# Patient Record
Sex: Female | Born: 1935 | Race: White | Hispanic: No | Marital: Married | Smoking: Never smoker
Health system: Southern US, Community
[De-identification: ages and names within clinical notes are randomized; demographics above are authoritative.]

## PROBLEM LIST (undated history)

## (undated) DIAGNOSIS — D649 Anemia, unspecified: Secondary | ICD-10-CM

## (undated) DIAGNOSIS — I499 Cardiac arrhythmia, unspecified: Secondary | ICD-10-CM

## (undated) DIAGNOSIS — R569 Unspecified convulsions: Secondary | ICD-10-CM

## (undated) DIAGNOSIS — K219 Gastro-esophageal reflux disease without esophagitis: Secondary | ICD-10-CM

## (undated) DIAGNOSIS — I10 Essential (primary) hypertension: Secondary | ICD-10-CM

## (undated) DIAGNOSIS — R296 Repeated falls: Secondary | ICD-10-CM

## (undated) DIAGNOSIS — F419 Anxiety disorder, unspecified: Secondary | ICD-10-CM

## (undated) DIAGNOSIS — J449 Chronic obstructive pulmonary disease, unspecified: Secondary | ICD-10-CM

## (undated) DIAGNOSIS — C50919 Malignant neoplasm of unspecified site of unspecified female breast: Secondary | ICD-10-CM

## (undated) DIAGNOSIS — F0150 Vascular dementia without behavioral disturbance: Secondary | ICD-10-CM

## (undated) HISTORY — PX: ABDOMINAL HYSTERECTOMY: SHX81

## (undated) HISTORY — PX: EYE SURGERY: SHX253

## (undated) HISTORY — PX: APPENDECTOMY: SHX54

---
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
## 1973-11-11 DIAGNOSIS — C50919 Malignant neoplasm of unspecified site of unspecified female breast: Secondary | ICD-10-CM

## 1973-11-11 HISTORY — PX: MASTECTOMY: SHX3

## 1973-11-11 HISTORY — DX: Malignant neoplasm of unspecified site of unspecified female breast: C50.919

## 2004-05-05 ENCOUNTER — Other Ambulatory Visit: Payer: Self-pay

## 2004-09-13 ENCOUNTER — Ambulatory Visit: Payer: Self-pay | Admitting: Gastroenterology

## 2006-02-19 ENCOUNTER — Ambulatory Visit: Payer: Self-pay | Admitting: Family Medicine

## 2007-03-27 ENCOUNTER — Ambulatory Visit: Payer: Self-pay | Admitting: Family Medicine

## 2008-04-11 ENCOUNTER — Ambulatory Visit: Payer: Self-pay | Admitting: Family Medicine

## 2009-08-23 ENCOUNTER — Ambulatory Visit: Payer: Self-pay | Admitting: Family Medicine

## 2010-09-11 ENCOUNTER — Ambulatory Visit: Payer: Self-pay | Admitting: Family Medicine

## 2011-11-14 ENCOUNTER — Ambulatory Visit: Payer: Self-pay | Admitting: Family Medicine

## 2013-07-22 ENCOUNTER — Observation Stay: Payer: Self-pay | Admitting: Internal Medicine

## 2013-07-22 LAB — URINALYSIS, COMPLETE
Bacteria: NONE SEEN
Blood: NEGATIVE
Glucose,UR: NEGATIVE mg/dL (ref 0–75)
Leukocyte Esterase: NEGATIVE
Ph: 7 (ref 4.5–8.0)
Protein: NEGATIVE
RBC,UR: 3 /HPF (ref 0–5)
WBC UR: <1 /HPF (ref 0–5)

## 2013-07-22 LAB — CBC
HCT: 39.1 % (ref 35.0–47.0)
MCH: 31 pg (ref 26.0–34.0)
MCV: 90 fL (ref 80–100)
RDW: 12.9 % (ref 11.5–14.5)

## 2013-07-22 LAB — COMPREHENSIVE METABOLIC PANEL
Alkaline Phosphatase: 81 U/L (ref 50–136)
Anion Gap: 6 — ABNORMAL LOW (ref 7–16)
BUN: 11 mg/dL (ref 7–18)
Co2: 30 mmol/L (ref 21–32)
Creatinine: 0.69 mg/dL (ref 0.60–1.30)
EGFR (Non-African Amer.): >60
Potassium: 4.1 mmol/L (ref 3.5–5.1)

## 2013-07-22 LAB — TROPONIN I
Troponin-I: <0.02 ng/mL
Troponin-I: <0.02 ng/mL

## 2013-07-22 LAB — CK TOTAL AND CKMB (NOT AT ARMC)
CK, Total: 69 U/L (ref 21–215)
CK-MB: 1 ng/mL (ref 0.5–3.6)
CK-MB: 0.9 ng/mL (ref 0.5–3.6)

## 2013-07-22 LAB — TSH: Thyroid Stimulating Horm: 2.69 u[IU]/mL

## 2013-07-23 LAB — CK TOTAL AND CKMB (NOT AT ARMC)
CK, Total: 77 U/L (ref 21–215)
CK-MB: 0.7 ng/mL (ref 0.5–3.6)

## 2013-10-11 ENCOUNTER — Ambulatory Visit: Payer: Self-pay | Admitting: Family Medicine

## 2013-11-04 ENCOUNTER — Emergency Department: Payer: Self-pay | Admitting: Emergency Medicine

## 2013-11-05 LAB — COMPREHENSIVE METABOLIC PANEL
Bilirubin,Total: 0.4 mg/dL (ref 0.2–1.0)
Chloride: 98 mmol/L (ref 98–107)
Co2: 32 mmol/L (ref 21–32)
Creatinine: 0.92 mg/dL (ref 0.60–1.30)
EGFR (Non-African Amer.): 59 — ABNORMAL LOW
Potassium: 3.9 mmol/L (ref 3.5–5.1)
SGOT(AST): 18 U/L (ref 15–37)
SGPT (ALT): 20 U/L (ref 12–78)
Total Protein: 7.7 g/dL (ref 6.4–8.2)

## 2013-11-05 LAB — URINALYSIS, COMPLETE
Bacteria: NONE SEEN
Bilirubin,UR: NEGATIVE
Glucose,UR: NEGATIVE mg/dL (ref 0–75)
Hyaline Cast: 24
Leukocyte Esterase: NEGATIVE
Ph: 5 (ref 4.5–8.0)
RBC,UR: 2 /HPF (ref 0–5)
Specific Gravity: 1.016 (ref 1.003–1.030)

## 2013-11-05 LAB — CBC
HGB: 14.1 g/dL (ref 12.0–16.0)
MCH: 30.1 pg (ref 26.0–34.0)
MCHC: 33.6 g/dL (ref 32.0–36.0)
MCV: 90 fL (ref 80–100)
Platelet: 253 10*3/uL (ref 150–440)
RBC: 4.68 10*6/uL (ref 3.80–5.20)
RDW: 13 % (ref 11.5–14.5)

## 2013-11-05 LAB — TROPONIN I: Troponin-I: <0.02 ng/mL

## 2013-11-05 LAB — CK TOTAL AND CKMB (NOT AT ARMC): CK-MB: 1.3 ng/mL (ref 0.5–3.6)

## 2014-04-28 ENCOUNTER — Ambulatory Visit: Payer: Self-pay | Admitting: Family Medicine

## 2015-03-03 NOTE — Consult Note (Signed)
PATIENT NAME:  Norma Newton, Norma Newton MR#:  [id number] DATE OF BIRTH:  27-Apr-1931  CARDIOLOGY CONSULTATION REPORT  DATE OF CONSULTATION:  07/23/2013  PRIMARY PHYSICIAN:  Ilene Qua.  REFERRING PHYSICIAN:  Fritzi Mandes, MD  CONSULTING PHYSICIAN:  Napolean Sia D. Orlander Norwood, MD  INDICATION: Atrial fibrillation.   HISTORY OF PRESENT ILLNESS: The patient is a 81 year old white female with a history of seizures, breast cancer status post mastectomy, started having left arm tingling, numbness as well as palpitations in the chest with heart racing. She did not describe it as pain, just took off with her. She checked her blood pressure and it was elevated, and her heart rate was fast so she called Rescue. The Rescue people came and told her she was in atrial fibrillation again and they gave her a dose of Cardizem, which slowed her heart rate down. Blood pressure reduced down to a systolic of 638. Heart rate was 130, and slowed down to 80s to 90s, so she was brought to the hospital and admitted for further evaluation and care.   She denies any prior history of atrial fibrillation or palpitations. The patient denies any prior cardiac history.   REVIEW OF SYSTEMS: No blackout spells or syncope. No nausea or vomiting. No fever, no chills, no sweats, weight loss. No weight gain. No hemoptysis, hematemesis. No bright red blood per rectum.   PAST MEDICAL HISTORY: Seizure disorder, B12 deficiency, breast cancer.   PAST SURGICAL HISTORY: Mastectomy, with chemo.   MEDICATIONS: Divalproex 250 mg 1 tablet 3 times a day, vitamin B12 1000 mg daily, alprazolam 0.25 mg at bedtime, aspirin 81 mg a day.   ALLERGIES: AMPICILLIN, DILANTIN, SELDANE.  FAMILY HISTORY: Myocardial infarction.   SOCIAL HISTORY: Lives at home with her husband. No smoking or alcohol consumption.   PHYSICAL EXAMINATION: VITAL SIGNS: Blood pressure was 130/70, pulse was 90 and irregular, respiratory rate 16, afebrile.  HEENT: Normocephalic, atraumatic.  Pupils equal, reactive to light.  NECK: Supple. No significant JVD, bruits or adenopathy.  LUNGS: Clear to auscultation and percussion. No significant wheeze or rhonchi or rale.  HEART: Regular rate and rhythm. Positive systolic ejection murmur along the sternal border. Irregularly irregular heartbeat. PMI is nondisplaced.  ABDOMEN: Benign. Positive bowel sounds. No rebound, guarding, or tenderness.  EXTREMITIES: Within normal limits. No significant cyanosis, clubbing, or edema.  NEUROLOGIC: Intact.  SKIN: Normal.   LABORATORIES: Chest x-ray negative.   EKG: Atrial fibrillation, rapid ventricular response initially, now controlled rate. CBC was normal. Cardiac enzymes are normal. MET-B was normal except for a glucose slightly elevated. Again, cardiac enzymes were negative.   IMPRESSIONS: Rapid atrial fibrillation, hypertension, history of chronic seizure disorder, history of breast cancer, palpitations, tachycardia, murmur.   PLAN:  1.  I agree with admitting, place on telemetry. Rule out for myocardial infarction. Follow up cardiac enzymes. Follow-up EKGs. Would treat with aspirin. Her CHADs score right now is I. I do not recommend long-term anticoagulation at this point unless cardiac echo which showed abnormalities within her valvular structures, or abnormal cardiac structures. Will continue aspirin for now. Rate-control with beta blocker or calcium blocker. Would consider echocardiogram. I would also consider functional study to rule out for ischemia and coronary artery disease.  2.  Hypertension: Will control blood pressure with the rate-control driver or a calcium blocker or beta blocker. Would also recommend evaluation for lipid elevation to see if she needs statin therapy. Again, refer for functional study for murmur. Echocardiogram to evaluate murmur for now to be she does  not have any valvular abnormalities.  3. Breast cancer: Appears to be stable.  4. Seizure disorder: Will continue her  seizure medications for now. I am not sure whether these medications reduce her risk for atrial fibrillation.  5. Continue Xanax p.r.n. for anxiety.  6.  No clear evidence of pulmonary emboli. Note, she was not short of breath, but should all be of concern in a patient with cancer who developed atrial fibrillation.   Will treat the patient conservatively for now. Hopefully she will convert back to sinus rhythm, and maybe consider further workup as an outpatient.    ____________________________ Loran Senters. Clayborn Bigness, MD ddc:dm D: 07/23/2013 12:50:23 ET T: 07/23/2013 19:04:00 ET JOB#: [id number]  cc: Crew Goren D. Clayborn Bigness, MD, <Dictator> Yolonda Kida MD ELECTRONICALLY SIGNED 08/09/2013 10:13

## 2015-03-03 NOTE — H&P (Signed)
PATIENT NAME:  Norma Newton, Norma Newton MR#:  [id number] DATE OF BIRTH:  03-27-1931  DATE OF ADMISSION:  07/22/2013  PRESENTING COMPLAINT:  Palpitations, not feeling well.  PRIMARY CARE PHYSICIAN:  Dr. Domenick Gong  Norma Newton is a very pleasant 81 year old Caucasian female with history of chronic seizure disorder and history of breast cancer status post mastectomy comes to the Emergency Room after she started having some numbness in her left upper extremity and then thereafter noted to have palpitations. She got her blood pressure checked and at that time was found to have tachycardia and came to the Emergency Room. She was found to be in rapid afib, heart rate in the 130s. Blood pressure was stable. She received 1 dose of 10 mg IV Cardizem and her heart rate went down into the 80s and 90s. Her blood pressure is 134/71. She is being admitted for new onset afib. The patient denies any heart disease or any cardiac issues in the past. She does have a strong family history of heart disease.  PAST MEDICAL HISTORY: 1.  Seizure disorder.  2.  Vitamin B12 deficiency.  3.  History of right breast cancer status post mastectomy 40 years ago.   MEDICATIONS: 1.  Divalproex sodium 250 mg 1 tablet 3 times a day.  2.  Cyanocobalamin 1000 mg p.o. daily.  3.  Alprazolam 0.25 mg p.o. at bedtime.  4.  Aspirin 81 mg daily.   ALLERGIES:   1.  AMPICILLIN. 2.  DILANTIN. 3.  SELDANE.  FAMILY HISTORY: Positive for  MI in mother.   SOCIAL HISTORY: Lives at home with her husband.  Nonsmoker, nonalcoholic.   REVIEW OF SYSTEMS:  CONSTITUTIONAL:  No fever, fatigue or weakness.  EYES:  No blurred or double vision. No glaucoma.  ENT:  No tinnitus, ear pain, hearing loss or epistaxis.  RESPIRATORY:  No cough, wheeze, hemoptysis or COPD. CARDIOVASCULAR:  No chest pain. Positive for arrhythmia. No dyspnea on exertion. Positive for palpitations.  GASTROINTESTINAL:  No nausea, vomiting, diarrhea, abdominal pain or  hematemesis.  GENITOURINARY:  No dysuria, hematuria or frequency.  ENDOCRINE:  No polyuria, nocturia or thyroid problems.  HEMATOLOGY:  No anemia or easy bruising.  SKIN:  No acne or rash.  MUSCULOSKELETAL:  Positive for arthritis.  NEUROLOGIC:  No CVA or TIA., dysarthria. PSYCHIATRIC:  No anxiety, depression or bipolar disorder.  All other systems reviewed and negative.   PHYSICAL EXAMINATION: GENERAL:  The patient is awake, alert, oriented x 3, not in acute distress.  VITAL SIGNS: She is afebrile, pulse is 96, afib, respirations 18. Blood pressure is 134/71. Pulse ox is 97% on room air.  HEENT: Atraumatic, normocephalic. Pupils PERRLA, EOMI intact. Oral mucosa is moist.  NECK: Supple. No JVD. No carotid bruit.  RESPIRATORY: Clear to auscultation bilaterally. No rales, rhonchi, respiratory distress or labored breathing. CARDIOVASCULAR: Irregularly irregular heart rhythm. No murmur heard. PMI not lateralized. CHEST:  Nontender.  EXTREMITIES:  Good pedal pulses, good femoral pulses. No lower extremity edema.  ABDOMEN:  Soft, benign, nontender. No organomegaly. Positive bowel sounds.  NEUROLOGIC:  Grossly intact cranial nerves II through XII. No motor or sensory deficit.  PSYCHIATRIC: The patient is awake, alert, oriented x 3.  EKG shows afib rapid with RVR.   Chest x-ray: No acute cardiopulmonary abnormality.   CBC within normal limits. First set of cardiac enzymes negative. Comprehensive metabolic panel within normal limits except glucose of 164.   ASSESSMENT:  A 81 year old, Norma Newton, with history of breast cancer and  history of chronic seizure disorder, comes in with:  1.  Rapid atrial fibrillation, new onset with rapid ventricular response.  Etiology not known at this time.  Will admit patient to telemetry floor. The patient's heart rate is improved with IV Cardizem, one-time dose. We will put her on 30 mg q. 6 hourly, monitor heart rate. Her [place] score is 1. Hence, I believe  the patient on baby aspirin at this time. Will order echo cardiac enzymes x 3 and check TSH. Cardiology consultation placed by Dr. Clayborn Bigness. Defer further recommendations per Dr. Clayborn Bigness.  2.  History of chronic seizure disorder, continue divalproex.  3.  History of breast cancer status post mastectomy.  4.  Deep venous thrombosis prophylaxis. The patient will be on heparin subcutaneous t.i.d. Above was discussed with the patient and patient's family members. Questions answered in the Emergency Room.   TIME SPENT:  55 minutes     ____________________________ Gus Height A. Posey Pronto, MD sap:ce D: 07/22/2013 15:40:51 ET T: 07/22/2013 15:52:35 ET JOB#: [id number]  cc: Norma Ayler A. Posey Pronto, MD, <Dictator> Norma Jarvis. Ilene Qua, MD  Ilda Basset MD ELECTRONICALLY SIGNED 07/23/2013 11:05

## 2015-03-03 NOTE — Discharge Summary (Signed)
PATIENT NAME:  Norma Newton, Norma Newton MR#:  [id number] DATE OF BIRTH:  [birth year]  DATE OF ADMISSION:  07/22/2013 DATE OF DISCHARGE:  07/23/2013  DISCHARGE DIAGNOSIS:  Transient atrial fibrillation, now back in normal sinus rhythm.  No symptoms.   SECONDARY DIAGNOSES: 1.  Seizure disorder.  2.  Vitamin B12 deficiency.   CONSULTATION:  Cardiology, Dr. Clayborn Bigness.   PROCEDURES AND RADIOLOGY:  Chest x-ray on 11th of September showed findings consistent with COPD.  No evidence of acute cardiopulmonary disease.   MAJOR LABORATORY PANEL:  UA on admission was negative.   HISTORY AND SHORT HOSPITAL COURSE:  The patient is a 81 year old female who was admitted for transient atrial fibrillation and had some symptoms.  Please see Dr. Gus Height Patel's dictated history and physical for further details.  Cardiology consultation was obtained with Dr. Clayborn Bigness who felt the patient's A. Fib. To be back in normal sinus rhythm.  The patient's CHADS score was very low and only baby aspirin was recommended for anticoagulation.  The patient was asymptomatic at this point and her rate was very well-controlled with rate controlling medication.  After discussion with cardiology and patient, along with family members, she was felt to be stable enough to be discharged home with outpatient follow-up with cardiology.    PHYSICAL EXAMINATION: VITAL SIGNS:  On the date of discharge, her vital signs were as follows:  Temperature 97.3, heart rate 75 per minute, respirations 18 per minute, blood pressure 128/72 mmHg.  She was saturating 98% on room air.  Pertinent physical examination on the date of discharge:  CARDIOVASCULAR:  S1, S2 normal.  No murmurs, rubs or gallops.  LUNGS:  Clear to auscultation bilaterally.  No wheezes, rales, rhonchi or crepitation.  ABDOMEN:  Soft, benign.  NEUROLOGIC:  Nonfocal examination.  All other physical examination remained at baseline.   DISCHARGE MEDICATIONS: 1.  Divalproex sodium 250 mg by mouth  3 times a day.  2.  Cyanocobalamin 1000 mcg injection once a month.  3.  Alprazolam 0.25 mg by mouth at bedtime.  4.  Aspirin 81 mg by mouth daily.  5.  Toprol-XL 25 mg by mouth daily.   DISCHARGE DIET:  Low-sodium.   DISCHARGE ACTIVITY:  As tolerated.   DISCHARGE INSTRUCTIONS AND FOLLOW-UP:  The patient was instructed to follow up with her primary care physician, Dr. Domenick Gong in 1 to 2 weeks.  She will need follow-up with Dr. Clayborn Bigness from [facility] on September 18th at [facility].   Total time discharging this patient was 45 minutes.    ____________________________ Lucina Mellow. Manuella Ghazi, MD vss:ea D: 07/23/2013 19:23:21 ET T: 07/24/2013 03:50:32 ET JOB#: [id number]  cc: Maecie Sevcik S. Manuella Ghazi, MD, <Dictator> Fonnie Jarvis. Ilene Qua, MD Dwayne D. Clayborn Bigness, [place] MD ELECTRONICALLY SIGNED 07/26/2013 10:27

## 2015-04-28 ENCOUNTER — Other Ambulatory Visit: Payer: Self-pay | Admitting: Family Medicine

## 2015-04-28 DIAGNOSIS — Z853 Personal history of malignant neoplasm of breast: Secondary | ICD-10-CM

## 2015-04-28 DIAGNOSIS — Z9011 Acquired absence of right breast and nipple: Secondary | ICD-10-CM

## 2015-04-28 DIAGNOSIS — Z1231 Encounter for screening mammogram for malignant neoplasm of breast: Secondary | ICD-10-CM

## 2015-05-23 ENCOUNTER — Ambulatory Visit: Payer: Medicare PPO

## 2015-05-30 ENCOUNTER — Ambulatory Visit
Admission: RE | Admit: 2015-05-30 | Discharge: 2015-05-30 | Disposition: A | Discharge status: Home / Self Care | Payer: Medicare PPO | Source: Ambulatory Visit | Attending: Family Medicine | Admitting: Family Medicine

## 2015-05-30 DIAGNOSIS — Z1231 Encounter for screening mammogram for malignant neoplasm of breast: Secondary | ICD-10-CM | POA: Diagnosis present

## 2015-05-30 DIAGNOSIS — Z853 Personal history of malignant neoplasm of breast: Secondary | ICD-10-CM

## 2015-05-30 DIAGNOSIS — Z9011 Acquired absence of right breast and nipple: Secondary | ICD-10-CM

## 2015-05-30 HISTORY — DX: Malignant neoplasm of unspecified site of unspecified female breast: C50.919

## 2019-06-28 ENCOUNTER — Other Ambulatory Visit: Payer: Self-pay

## 2019-06-30 ENCOUNTER — Other Ambulatory Visit: Payer: Self-pay | Admitting: Gerontology

## 2019-06-30 ENCOUNTER — Other Ambulatory Visit (HOSPITAL_COMMUNITY): Payer: Self-pay | Admitting: Gerontology

## 2019-06-30 DIAGNOSIS — G8929 Other chronic pain: Secondary | ICD-10-CM

## 2019-06-30 DIAGNOSIS — R413 Other amnesia: Secondary | ICD-10-CM

## 2019-06-30 DIAGNOSIS — R531 Weakness: Secondary | ICD-10-CM

## 2019-07-05 ENCOUNTER — Encounter (INDEPENDENT_AMBULATORY_CARE_PROVIDER_SITE_OTHER): Payer: Self-pay

## 2019-07-05 ENCOUNTER — Other Ambulatory Visit: Payer: Self-pay

## 2019-07-05 ENCOUNTER — Ambulatory Visit
Admission: RE | Admit: 2019-07-05 | Discharge: 2019-07-05 | Disposition: A | Discharge status: Home / Self Care | Payer: Medicare PPO | Source: Ambulatory Visit | Attending: Gerontology | Admitting: Gerontology

## 2019-07-05 DIAGNOSIS — R531 Weakness: Secondary | ICD-10-CM | POA: Diagnosis present

## 2019-07-05 DIAGNOSIS — R413 Other amnesia: Secondary | ICD-10-CM | POA: Diagnosis not present

## 2019-07-05 HISTORY — DX: Essential (primary) hypertension: I10

## 2019-07-05 MED ORDER — IOHEXOL 300 MG/ML  SOLN
75.0000 mL | Freq: Once | INTRAMUSCULAR | Status: AC | PRN
  Administered 2019-07-05: 14:00:00 75 mL via INTRAVENOUS

## 2019-07-12 ENCOUNTER — Other Ambulatory Visit: Payer: Self-pay

## 2019-07-12 ENCOUNTER — Ambulatory Visit
Admission: RE | Admit: 2019-07-12 | Discharge: 2019-07-12 | Disposition: A | Discharge status: Home / Self Care | Payer: Medicare PPO | Source: Ambulatory Visit | Attending: Gerontology | Admitting: Gerontology

## 2019-07-12 DIAGNOSIS — M5442 Lumbago with sciatica, left side: Secondary | ICD-10-CM | POA: Insufficient documentation

## 2019-07-12 DIAGNOSIS — G8929 Other chronic pain: Secondary | ICD-10-CM | POA: Diagnosis present

## 2019-07-12 MED ORDER — GADOBUTROL 1 MMOL/ML IV SOLN
5.0000 mL | Freq: Once | INTRAVENOUS | Status: AC | PRN
  Administered 2019-07-12: 5 mL via INTRAVENOUS

## 2019-07-21 ENCOUNTER — Emergency Department
Admission: EM | Admit: 2019-07-21 | Discharge: 2019-07-21 | Disposition: A | Discharge status: Home / Self Care | Payer: Medicare PPO | Attending: Emergency Medicine | Admitting: Emergency Medicine

## 2019-07-21 ENCOUNTER — Emergency Department: Payer: Medicare PPO

## 2019-07-21 ENCOUNTER — Other Ambulatory Visit: Payer: Self-pay

## 2019-07-21 DIAGNOSIS — Y929 Unspecified place or not applicable: Secondary | ICD-10-CM | POA: Diagnosis not present

## 2019-07-21 DIAGNOSIS — Y999 Unspecified external cause status: Secondary | ICD-10-CM | POA: Insufficient documentation

## 2019-07-21 DIAGNOSIS — S7002XA Contusion of left hip, initial encounter: Secondary | ICD-10-CM | POA: Insufficient documentation

## 2019-07-21 DIAGNOSIS — Y939 Activity, unspecified: Secondary | ICD-10-CM | POA: Insufficient documentation

## 2019-07-21 DIAGNOSIS — W010XXA Fall on same level from slipping, tripping and stumbling without subsequent striking against object, initial encounter: Secondary | ICD-10-CM | POA: Diagnosis not present

## 2019-07-21 DIAGNOSIS — S93401A Sprain of unspecified ligament of right ankle, initial encounter: Secondary | ICD-10-CM | POA: Insufficient documentation

## 2019-07-21 DIAGNOSIS — Z79899 Other long term (current) drug therapy: Secondary | ICD-10-CM | POA: Insufficient documentation

## 2019-07-21 DIAGNOSIS — I10 Essential (primary) hypertension: Secondary | ICD-10-CM | POA: Diagnosis not present

## 2019-07-21 DIAGNOSIS — W19XXXA Unspecified fall, initial encounter: Secondary | ICD-10-CM

## 2019-07-21 DIAGNOSIS — S70912A Unspecified superficial injury of left hip, initial encounter: Secondary | ICD-10-CM | POA: Diagnosis present

## 2019-07-21 LAB — CBC WITH DIFFERENTIAL/PLATELET
Abs Immature Granulocytes: 0.03 10*3/uL (ref 0.00–0.07)
Basophils Absolute: 0 10*3/uL (ref 0.0–0.1)
Basophils Relative: 0 %
Eosinophils Absolute: 0.1 10*3/uL (ref 0.0–0.5)
Eosinophils Relative: 1 %
HCT: 38.8 % (ref 36.0–46.0)
Hemoglobin: 12.9 g/dL (ref 12.0–15.0)
Immature Granulocytes: 0 %
Lymphocytes Relative: 20 %
Lymphs Abs: 1.4 10*3/uL (ref 0.7–4.0)
MCH: 30.1 pg (ref 26.0–34.0)
MCHC: 33.2 g/dL (ref 30.0–36.0)
MCV: 90.4 fL (ref 80.0–100.0)
Monocytes Absolute: 0.9 10*3/uL (ref 0.1–1.0)
Monocytes Relative: 13 %
Neutro Abs: 4.6 10*3/uL (ref 1.7–7.7)
Neutrophils Relative %: 66 %
Platelets: 325 10*3/uL (ref 150–400)
RBC: 4.29 MIL/uL (ref 3.87–5.11)
RDW: 11.8 % (ref 11.5–15.5)
WBC: 7.1 10*3/uL (ref 4.0–10.5)
nRBC: 0 % (ref 0.0–0.2)

## 2019-07-21 LAB — COMPREHENSIVE METABOLIC PANEL
ALT: 15 U/L (ref 0–44)
AST: 24 U/L (ref 15–41)
Albumin: 4.1 g/dL (ref 3.5–5.0)
Alkaline Phosphatase: 171 U/L — ABNORMAL HIGH (ref 38–126)
Anion gap: 11 (ref 5–15)
BUN: 13 mg/dL (ref 8–23)
CO2: 30 mmol/L (ref 22–32)
Calcium: 9.8 mg/dL (ref 8.9–10.3)
Chloride: 89 mmol/L — ABNORMAL LOW (ref 98–111)
Creatinine, Ser: 0.55 mg/dL (ref 0.44–1.00)
GFR calc Af Amer: >60 mL/min (ref >60)
GFR calc non Af Amer: >60 mL/min (ref >60)
Glucose, Bld: 122 mg/dL — ABNORMAL HIGH (ref 70–99)
Potassium: 4.2 mmol/L (ref 3.5–5.1)
Sodium: 130 mmol/L — ABNORMAL LOW (ref 135–145)
Total Bilirubin: 0.6 mg/dL (ref 0.3–1.2)
Total Protein: 7.8 g/dL (ref 6.5–8.1)

## 2019-07-21 LAB — TYPE AND SCREEN
ABO/RH(D): B POS
Antibody Screen: NEGATIVE

## 2019-07-21 LAB — PROTIME-INR
INR: 1 (ref 0.8–1.2)
Prothrombin Time: 12.8 seconds (ref 11.4–15.2)

## 2019-07-21 LAB — APTT: aPTT: 26 seconds (ref 24–36)

## 2019-07-21 MED ORDER — FENTANYL CITRATE (PF) 100 MCG/2ML IJ SOLN: 50.0000 ug | INTRAMUSCULAR | Status: DC | PRN

## 2019-07-21 NOTE — ED Notes (Signed)
Printed discharge signature paper.

## 2019-07-21 NOTE — ED Notes (Signed)
Pt assisted to bathroom with ED tech Myra. Pt ambulatory.

## 2019-07-21 NOTE — ED Triage Notes (Signed)
Pt arrived with [facility] from home with complaint of R and L hip pain and L ankle pain. PT states she missed the chair while attempting to sit and fell landing on her L hip. EMS reports finding pt laying on her L side on arrival. EMS also reports some R ankle swelling.  Pt is alert and oriented. This is baseline. EMS states pt takes tramadol for chronic back pain. Pt pupils uneven size.  BP 177/99 O2 97% room air HR 109 BS 122 T 98.4 oral  MD at bedside.

## 2019-07-21 NOTE — ED Notes (Signed)
Patient transported to X-ray 

## 2019-07-21 NOTE — ED Provider Notes (Signed)
[facility] Emergency Department Provider Note   ____________________________________________    I have reviewed the triage vital signs and the nursing notes.   HISTORY  Chief Complaint Fall     HPI Norma Newton is a 85 y.o. female who presents after a fall.  Patient reports she lost her balance and fell forward and onto her left hip.  She did not hit her head.  She denies neck pain.  No back pain.  No chest wall pain.  No abdominal pain nausea or vomiting.  She complains only of pain in her left hip.  She also has some chronic pain in her right leg and also notes some pain in her right ankle.  No other injuries reported.  Past Medical History:  Diagnosis Date  . Breast cancer ([place]) 1975   right breast  . Hypertension     There are no active problems to display for this patient.   Past Surgical History:  Procedure Laterality Date  . MASTECTOMY Right 1975   positive    Prior to Admission medications   Medication Sig Start Date End Date Taking? Authorizing Provider  acetaminophen (TYLENOL) 650 MG CR tablet Take 1,300 mg by mouth every 8 (eight) hours as needed for pain.    [provider]  ASPERCREME LIDOCAINE EX Apply 1 application topically daily as needed (pain).    [provider]  divalproex (DEPAKOTE) 250 MG DR tablet Take 250 mg by mouth 3 (three) times daily. 05/07/19   [provider]  fluticasone (VERAMYST) 27.5 MCG/SPRAY nasal spray Place 2 sprays into the nose daily.    [provider]  loratadine (CLARITIN) 10 MG tablet Take 10 mg by mouth daily.    [provider]  Melatonin 3 MG CAPS Take 3 mg by mouth at bedtime.    [provider]  memantine (NAMENDA) 5 MG tablet Take 5 mg by mouth 2 (two) times daily.    [provider]  metoprolol tartrate (LOPRESSOR) 25 MG tablet Take 25 mg by mouth 2 (two) times daily.    [provider]  Multiple Vitamin  (MULTIVITAMIN WITH MINERALS) TABS tablet Take 1 tablet by mouth daily.    [provider]  rivaroxaban (XARELTO) 10 MG TABS tablet Take 10 mg by mouth daily with supper.    [provider]  traMADol (ULTRAM) 50 MG tablet Take 25-50 mg by mouth every 6 (six) hours as needed for pain. 07/16/19   [provider]     Allergies Doxycycline calcium  Family History  Problem Relation Age of Onset  . Breast cancer Sister   . Breast cancer Sister     Social History Social History   Tobacco Use  . Smoking status: Not on file  Substance Use Topics  . Alcohol use: Not on file  . Drug use: Not on file    Review of Systems  Constitutional: No dizziness Eyes: No visual changes.  ENT: No neck pain Cardiovascular: Denies chest wall pain. Respiratory: Denies shortness of breath. Gastrointestinal: As above Genitourinary: No groin injury Musculoskeletal: As above Skin: Negative for laceration Neurological: Negative for headaches or weakness   ____________________________________________   PHYSICAL EXAM:  VITAL SIGNS: ED Triage Vitals  Enc Vitals Group     BP 07/21/19 1755 (!) 179/101     Pulse Rate 07/21/19 1755 (!) 106     Resp 07/21/19 1755 20     Temp 07/21/19 1834 97.9 F (36.6 C)  Temp Source 07/21/19 1834 Oral     SpO2 07/21/19 1755 98 %     Weight 07/21/19 1756 63.5 kg (140 lb)     Height 07/21/19 1756 1.626 m (5\' 4" )     Head Circumference --      Peak Flow --      Pain Score 07/21/19 1756 10     Pain Loc --      Pain Edu? --      Excl. in [place]? --     Constitutional: Alert and oriented.  Eyes: Conjunctivae are normal.  Head: Atraumatic. Nose: No congestion/rhinnorhea. Mouth/Throat: Mucous membranes are moist.   Neck:  Painless ROM, no vertebral has palpation, no pain with axial load Cardiovascular: Normal rate, regular rhythm. Good peripheral circulation.  No chest wall pain Respiratory: Normal respiratory effort.  No retractions.   Gastrointestinal: Soft and nontender. No distention. Genitourinary: deferred Musculoskeletal: Full range of motion of upper extremities without pain.  Some pain with axial load on the left leg but no shortening.  Mild swelling of the right ankle, no bony abnormalities.  Normal pulses in all extremities. Neurologic:  Normal speech and language. No gross focal neurologic deficits are appreciated.  Skin:  Skin is warm, dry and intact. No rash noted. Psychiatric: Mood and affect are normal. Speech and behavior are normal.  ____________________________________________   LABS (all labs ordered are listed, but only abnormal results are displayed)  Labs Reviewed  COMPREHENSIVE METABOLIC PANEL - Abnormal; Notable for the following components:      Result Value   Sodium 130 (*)    Chloride 89 (*)    Glucose, Bld 122 (*)    Alkaline Phosphatase 171 (*)    All other components within normal limits  CBC WITH DIFFERENTIAL/PLATELET  PROTIME-INR  APTT  TYPE AND SCREEN   ____________________________________________  EKG  ED ECG REPORT I, Lavonia Drafts, the attending physician, personally viewed and interpreted this ECG.  Date: 07/21/2019  Rhythm: Atrial fibrillation QRS Axis: normal Intervals: Abnormal ST/T Wave abnormalities: normal Narrative Interpretation: no evidence of acute ischemia  ____________________________________________  RADIOLOGY  Chest x-ray unremarkable ____________________________________________   PROCEDURES  Procedure(s) performed: No  Procedures   Critical Care performed: No ____________________________________________   INITIAL IMPRESSION / ASSESSMENT AND PLAN / ED COURSE  Pertinent labs & imaging results that were available during my care of the patient were reviewed by me and considered in my medical decision making (see chart for details).  Patient presents after mechanical fall, does complain of pain in her left hip and right ankle, no other  injuries on exam.  Fentanyl IV ordered as needed  Labs thus far reassuring.  Pelvis and hip x-ray negative for fracture  Ankle x-ray negative for fracture  Patient was able to ambulate to the bathroom with minimal assistance.    ____________________________________________   FINAL CLINICAL IMPRESSION(S) / ED DIAGNOSES  Final diagnoses:  Fall, initial encounter  Contusion of left hip, initial encounter  Sprain of right ankle, unspecified ligament, initial encounter        Note:  This document was prepared using Dragon voice recognition software and may include unintentional dictation errors.   Lavonia Drafts, MD 07/21/19 [phone]

## 2019-07-23 ENCOUNTER — Other Ambulatory Visit: Payer: Medicare PPO

## 2019-07-23 ENCOUNTER — Other Ambulatory Visit: Admission: RE | Admit: 2019-07-23 | Payer: Medicare PPO | Source: Ambulatory Visit

## 2019-07-23 ENCOUNTER — Encounter
Admission: RE | Admit: 2019-07-23 | Discharge: 2019-07-23 | Disposition: A | Discharge status: Home / Self Care | Payer: Medicare PPO | Source: Ambulatory Visit | Attending: Orthopedic Surgery | Admitting: Orthopedic Surgery

## 2019-07-23 ENCOUNTER — Other Ambulatory Visit: Payer: Self-pay

## 2019-07-23 DIAGNOSIS — Z01812 Encounter for preprocedural laboratory examination: Secondary | ICD-10-CM | POA: Insufficient documentation

## 2019-07-23 DIAGNOSIS — R296 Repeated falls: Secondary | ICD-10-CM

## 2019-07-23 DIAGNOSIS — Z20828 Contact with and (suspected) exposure to other viral communicable diseases: Secondary | ICD-10-CM | POA: Insufficient documentation

## 2019-07-23 HISTORY — DX: Vascular dementia, unspecified severity, without behavioral disturbance, psychotic disturbance, mood disturbance, and anxiety: F01.50

## 2019-07-23 HISTORY — DX: Anxiety disorder, unspecified: F41.9

## 2019-07-23 HISTORY — DX: Unspecified convulsions: R56.9

## 2019-07-23 HISTORY — DX: Anemia, unspecified: D64.9

## 2019-07-23 HISTORY — DX: Repeated falls: R29.6

## 2019-07-23 HISTORY — DX: Chronic obstructive pulmonary disease, unspecified: J44.9

## 2019-07-23 HISTORY — DX: Cardiac arrhythmia, unspecified: I49.9

## 2019-07-23 HISTORY — DX: Gastro-esophageal reflux disease without esophagitis: K21.9

## 2019-07-23 NOTE — Pre-Procedure Instructions (Signed)
Patient had recent fall at home and complained of pain in left foot.  On arrival to [facility] testing, the left foot was swollen with 2+ pitting edema.  Bruising noted from mid foot to toes. Very tender to touch and provides a good amount of pain.  Patient was having difficulty with memory today.  Please include daughter in all medical discussions.

## 2019-07-23 NOTE — Patient Instructions (Signed)
INSTRUCTIONS FOR SURGERY     Your surgery is scheduled for:   Tuesday, July 27, 2019     To find out your arrival time for the day of surgery,          please call [phone] between 1 pm and 3 pm on :  Monday, July 26, 2019     When you arrive for surgery, report to the [facility].       Do NOT stop on the first floor to register.    REMEMBER: Instructions that are not followed completely may result in serious medical risk,  up to and including death, or upon the discretion of your surgeon and anesthesiologist,            your surgery may need to be rescheduled.  __X__ 1. Do not eat food after midnight the night before your procedure.                    No gum, candy, lozenger, tic tacs, tums or hard candies.                  ABSOLUTELY NOTHING SOLID IN YOUR MOUTH AFTER MIDNIGHT                    You may drink unlimited clear liquids up to 2 hours before you are scheduled to arrive for surgery.                   Do not drink anything within those 2 hours unless you need to take medicine, then take the                   smallest amount you need.  Clear liquids include:  water, apple juice without pulp,                   any flavor Gatorade, Black coffee, black tea.  Sugar may be added but no dairy/ honey /lemon.                        Broth and jello is not considered a clear liquid.  __x__  2. On the morning of surgery, please brush your teeth with toothpaste and water. You may rinse with                  mouthwash if you wish but DO NOT SWALLOW TOOTHPASTE OR MOUTHWASH  __X___3. NO alcohol for 24 hours before or after surgery.  __x___ 4.  Do NOT smoke or use e-cigarettes for 24 HOURS PRIOR TO SURGERY.                      DO NOT Use any chewable tobacco products for at least 6 hours prior to surgery.  __x___ 5. If you start any new medication after this appointment and prior to surgery,  please                   Bring it with you on the day of surgery.  ___x__ 6. Notify your doctor if there is any  change in your medical condition, such as fever, infection, vomitting,                   Diarrhea or any open sores.  __x___ 7.  USE the CHG SOAP as instructed, the night before surgery and the day of surgery.                   Once you have washed with this soap, do NOT use any of the following: Powders, perfumes                    or lotions. Please do not wear make up, hairpins, clips or nail polish. You MAY wear deodorant.                   Men may shave their face and neck.  Women need to shave 48 hours prior to surgery.                   DO NOT wear ANY jewelry on the day of surgery. If there are rings that are too tight to                    remove easily, please address this prior to the surgery day. Piercings need to be removed.                                                                     NO METAL ON YOUR BODY.                    Do NOT bring any valuables.  If you came to Pre-Admit testing then you will not need license,                     insurance card or credit card.  If you will be staying overnight, please either leave your things in                     the car or have your family be responsible for these items.                     [facility] IS NOT RESPONSIBLE FOR BELONGINGS OR VALUABLES.  ___X__ 8. DO NOT wear contact lenses on surgery day.  You may not have dentures,                     Hearing aides, contacts or glasses in the operating room. These items can be                    Placed in the Recovery Room to receive immediately after surgery.  __x___ 9. IF YOU ARE SCHEDULED TO GO HOME ON THE SAME DAY, YOU MUST                   Have someone to drive you home and to stay with you  for the first 24 hours.                    Have an arrangement prior to arriving on surgery day.  ___x__ 10. Take the following  medications on the morning of surgery with a  sip of water:                              1. METOPROLOL                     2. DEPAKOTE                     3. NAMENDA                     4. CLARITIN                     5. NASAL SPRAY                     6. TRAMADOL AND TYLENOL (may repeat later if arrival time is late)  _____ 54.  Follow any instructions provided to you by your surgeon.                        Such as enema, clear liquid bowel prep  __X__  12. STOP XARELTO  AS OF: TODAY                       THIS INCLUDES BC POWDERS / GOODIES POWDER  __x___ 13. STOP Anti-inflammatories as of: TODAY                      This includes IBUPROFEN / MOTRIN / ADVIL / ALEVE/ NAPROXYN                    YOU MAY TAKE TYLENOL ANY TIME PRIOR TO SURGERY.  __X___ 14.  Stop supplements until after surgery.                     This includes:  MULTIVITS                  __X____15.  Continue to take the following medications but do not take on the morning of surgery:                           ASPERCREAM  ______16 . If staying overnight, please have appropriate shoes to wear to be able to walk around the unit.                   Wear clean and comfortable clothing to the hospital.  IF YOU HAVE COMPLETED THE ADVANCED DIRECTIVES AND HAD IT NOTARIZED, PLEASE BRING     WITH YOU TO SURGERY SO WE CAN PUT A COPY IN YOUR CHART.

## 2019-07-23 NOTE — Pre-Procedure Instructions (Signed)
ECG 12-lead6/01/2019 [facility] Component Name Value Ref Range  Vent Rate (bpm) 76   QRS Interval (msec) 90   QT Interval (msec) 376   QTc (msec) 423   Other Result Information  This result has an attachment that is not available.  Result Narrative  Atrial fibrillation Abnormal ECG When compared with ECG of 19-Oct-2018 10:33, Nonspecific T wave abnormalities no longer evident in Lateral leads I reviewed and concur with this report. Electronically signed [id number] MD, KEN ([id number]) on 04/16/2019 2:04:21 PM

## 2019-07-24 LAB — SARS CORONAVIRUS 2 (TAT 6-24 HRS): SARS Coronavirus 2: NEGATIVE

## 2019-07-27 ENCOUNTER — Ambulatory Visit
Admission: RE | Admit: 2019-07-27 | Discharge: 2019-07-27 | Disposition: A | Discharge status: Home / Self Care | Payer: Medicare PPO | Attending: Orthopedic Surgery | Admitting: Orthopedic Surgery

## 2019-07-27 ENCOUNTER — Ambulatory Visit: Payer: Medicare PPO | Admitting: Anesthesiology

## 2019-07-27 ENCOUNTER — Encounter
Admission: RE | Disposition: A | Discharge status: Home / Self Care | Payer: Self-pay | Source: Home / Self Care | Attending: Orthopedic Surgery

## 2019-07-27 ENCOUNTER — Ambulatory Visit: Payer: Medicare PPO

## 2019-07-27 ENCOUNTER — Encounter: Payer: Self-pay | Admitting: *Deleted

## 2019-07-27 DIAGNOSIS — Z7901 Long term (current) use of anticoagulants: Secondary | ICD-10-CM | POA: Insufficient documentation

## 2019-07-27 DIAGNOSIS — I10 Essential (primary) hypertension: Secondary | ICD-10-CM | POA: Diagnosis not present

## 2019-07-27 DIAGNOSIS — M899 Disorder of bone, unspecified: Secondary | ICD-10-CM | POA: Insufficient documentation

## 2019-07-27 DIAGNOSIS — I4891 Unspecified atrial fibrillation: Secondary | ICD-10-CM | POA: Insufficient documentation

## 2019-07-27 DIAGNOSIS — W1830XA Fall on same level, unspecified, initial encounter: Secondary | ICD-10-CM | POA: Insufficient documentation

## 2019-07-27 DIAGNOSIS — S32040A Wedge compression fracture of fourth lumbar vertebra, initial encounter for closed fracture: Secondary | ICD-10-CM | POA: Insufficient documentation

## 2019-07-27 DIAGNOSIS — G4089 Other seizures: Secondary | ICD-10-CM | POA: Diagnosis not present

## 2019-07-27 DIAGNOSIS — Z881 Allergy status to other antibiotic agents status: Secondary | ICD-10-CM | POA: Insufficient documentation

## 2019-07-27 DIAGNOSIS — Z853 Personal history of malignant neoplasm of breast: Secondary | ICD-10-CM | POA: Diagnosis not present

## 2019-07-27 DIAGNOSIS — S32030A Wedge compression fracture of third lumbar vertebra, initial encounter for closed fracture: Secondary | ICD-10-CM | POA: Insufficient documentation

## 2019-07-27 DIAGNOSIS — J449 Chronic obstructive pulmonary disease, unspecified: Secondary | ICD-10-CM | POA: Diagnosis not present

## 2019-07-27 DIAGNOSIS — Z79899 Other long term (current) drug therapy: Secondary | ICD-10-CM | POA: Insufficient documentation

## 2019-07-27 DIAGNOSIS — S32050A Wedge compression fracture of fifth lumbar vertebra, initial encounter for closed fracture: Secondary | ICD-10-CM

## 2019-07-27 HISTORY — PX: SACROPLASTY: SHX6797

## 2019-07-27 HISTORY — PX: KYPHOPLASTY: SHX5884

## 2019-07-27 SURGERY — SACROPLASTY
Anesthesia: General | Site: Back

## 2019-07-27 MED ORDER — FENTANYL CITRATE (PF) 100 MCG/2ML IJ SOLN: 25.0000 ug | INTRAMUSCULAR | Status: DC | PRN

## 2019-07-27 MED ORDER — METOCLOPRAMIDE HCL 5 MG/ML IJ SOLN: 5.0000 mg | Freq: Three times a day (TID) | INTRAMUSCULAR | Status: DC | PRN

## 2019-07-27 MED ORDER — HYDROCODONE-ACETAMINOPHEN 7.5-325 MG PO TABS
1.0000 | ORAL_TABLET | ORAL | Status: DC | PRN
  Filled 2019-07-27: qty 1

## 2019-07-27 MED ORDER — LACTATED RINGERS IV SOLN
INTRAVENOUS | Status: DC
  Administered 2019-07-27: 15:00:00 100 mL/h via INTRAVENOUS
  Administered 2019-07-27: 17:00:00 via INTRAVENOUS

## 2019-07-27 MED ORDER — LIDOCAINE HCL (CARDIAC) PF 100 MG/5ML IV SOSY
PREFILLED_SYRINGE | INTRAVENOUS | Status: DC | PRN
  Administered 2019-07-27: 60 mg via INTRAVENOUS

## 2019-07-27 MED ORDER — SODIUM CHLORIDE 0.9 % IV SOLN: INTRAVENOUS | Status: DC

## 2019-07-27 MED ORDER — BUPIVACAINE-EPINEPHRINE (PF) 0.5% -1:200000 IJ SOLN
INTRAMUSCULAR | Status: DC | PRN
  Administered 2019-07-27: 10 mL via PERINEURAL
  Administered 2019-07-27: 20 mL via PERINEURAL

## 2019-07-27 MED ORDER — HYDROCODONE-ACETAMINOPHEN 5-325 MG PO TABS: 1.0000 | ORAL_TABLET | ORAL | Status: DC | PRN

## 2019-07-27 MED ORDER — METHYLPREDNISOLONE SODIUM SUCC 125 MG IJ SOLR
INTRAMUSCULAR | Status: AC
  Filled 2019-07-27: qty 2

## 2019-07-27 MED ORDER — PROPOFOL 10 MG/ML IV BOLUS
INTRAVENOUS | Status: AC
  Filled 2019-07-27: qty 20

## 2019-07-27 MED ORDER — ACETAMINOPHEN 325 MG PO TABS: 325.0000 mg | ORAL_TABLET | Freq: Four times a day (QID) | ORAL | Status: DC | PRN

## 2019-07-27 MED ORDER — ACETAMINOPHEN 500 MG PO TABS: 500.0000 mg | ORAL_TABLET | Freq: Four times a day (QID) | ORAL | Status: DC

## 2019-07-27 MED ORDER — FLEET ENEMA 7-19 GM/118ML RE ENEM
1.0000 | ENEMA | Freq: Once | RECTAL | Status: AC
  Administered 2019-07-27: 1 via RECTAL

## 2019-07-27 MED ORDER — FAMOTIDINE 20 MG PO TABS
ORAL_TABLET | ORAL | Status: AC
  Administered 2019-07-27: 15:00:00 20 mg via ORAL
  Filled 2019-07-27: qty 1

## 2019-07-27 MED ORDER — CEFAZOLIN SODIUM-DEXTROSE 2-4 GM/100ML-% IV SOLN
2.0000 g | Freq: Once | INTRAVENOUS | Status: AC
  Administered 2019-07-27: 16:00:00 2 g via INTRAVENOUS

## 2019-07-27 MED ORDER — ONDANSETRON HCL 4 MG/2ML IJ SOLN: 4.0000 mg | Freq: Four times a day (QID) | INTRAMUSCULAR | Status: DC | PRN

## 2019-07-27 MED ORDER — CEFAZOLIN SODIUM-DEXTROSE 2-4 GM/100ML-% IV SOLN
INTRAVENOUS | Status: AC
  Filled 2019-07-27: qty 100

## 2019-07-27 MED ORDER — KETAMINE HCL 50 MG/ML IJ SOLN
INTRAMUSCULAR | Status: AC
  Filled 2019-07-27: qty 10

## 2019-07-27 MED ORDER — ONDANSETRON HCL 4 MG PO TABS: 4.0000 mg | ORAL_TABLET | Freq: Four times a day (QID) | ORAL | Status: DC | PRN

## 2019-07-27 MED ORDER — PROPOFOL 10 MG/ML IV BOLUS
INTRAVENOUS | Status: DC | PRN
  Administered 2019-07-27 (×3): 20 mg via INTRAVENOUS

## 2019-07-27 MED ORDER — LIDOCAINE HCL 1 % IJ SOLN
INTRAMUSCULAR | Status: DC | PRN
  Administered 2019-07-27 (×2): 10 mL
  Administered 2019-07-27: 15 mL

## 2019-07-27 MED ORDER — MORPHINE SULFATE (PF) 4 MG/ML IV SOLN: 0.5000 mg | INTRAVENOUS | Status: DC | PRN

## 2019-07-27 MED ORDER — FAMOTIDINE 20 MG PO TABS
20.0000 mg | ORAL_TABLET | Freq: Once | ORAL | Status: AC
  Administered 2019-07-27: 15:00:00 20 mg via ORAL

## 2019-07-27 MED ORDER — PROPOFOL 500 MG/50ML IV EMUL
INTRAVENOUS | Status: DC | PRN
  Administered 2019-07-27: 15 ug/kg/min via INTRAVENOUS

## 2019-07-27 MED ORDER — KETAMINE HCL 10 MG/ML IJ SOLN
INTRAMUSCULAR | Status: DC | PRN
  Administered 2019-07-27 (×4): 10 mg via INTRAVENOUS

## 2019-07-27 MED ORDER — IOHEXOL 180 MG/ML  SOLN
INTRAMUSCULAR | Status: DC | PRN
  Administered 2019-07-27: 40 mL

## 2019-07-27 MED ORDER — METHYLPREDNISOLONE SODIUM SUCC 125 MG IJ SOLR
INTRAMUSCULAR | Status: DC | PRN
  Administered 2019-07-27: 40 mg via INTRAVENOUS

## 2019-07-27 MED ORDER — METOCLOPRAMIDE HCL 10 MG PO TABS: 5.0000 mg | ORAL_TABLET | Freq: Three times a day (TID) | ORAL | Status: DC | PRN

## 2019-07-27 MED ORDER — BUPIVACAINE-EPINEPHRINE (PF) 0.5% -1:200000 IJ SOLN
INTRAMUSCULAR | Status: AC
  Filled 2019-07-27: qty 30

## 2019-07-27 SURGICAL SUPPLY — 29 items
BIT DRILL KYPHON EXPRESS (MISCELLANEOUS) IMPLANT
BNDG ADH 2 X3.75 FABRIC TAN LF (GAUZE/BANDAGES/DRESSINGS) ×6 IMPLANT
BONE FILLER DEVICE STRL SZ3 (INSTRUMENTS) ×4 IMPLANT
CEMENT KYPHON CX01A KIT/MIXER (Cement) ×5 IMPLANT
COVER WAND RF STERILE (DRAPES) ×3 IMPLANT
DERMABOND ADVANCED (GAUZE/BANDAGES/DRESSINGS) ×6
DERMABOND ADVANCED .7 DNX12 (GAUZE/BANDAGES/DRESSINGS) ×1 IMPLANT
DEVICE BIOPSY BONE KYPH (INSTRUMENTS) ×2 IMPLANT
DEVICE BIOPSY BONE KYPHX (INSTRUMENTS) ×5 IMPLANT
DRAPE C-ARM XRAY 36X54 (DRAPES) ×3 IMPLANT
DRILL KYPHON EXPRESS (MISCELLANEOUS) ×3
DURAPREP 26ML APPLICATOR (WOUND CARE) ×3 IMPLANT
FEE RENTAL RFA GENERATOR (MISCELLANEOUS) IMPLANT
GLOVE SURG SYN 9.0  PF PI (GLOVE) ×2
GLOVE SURG SYN 9.0 PF PI (GLOVE) ×1 IMPLANT
GOWN SRG 2XL LVL 4 RGLN SLV (GOWNS) ×1 IMPLANT
GOWN STRL NON-REIN 2XL LVL4 (GOWNS) ×2
GOWN STRL REUS W/ TWL LRG LVL3 (GOWN DISPOSABLE) ×1 IMPLANT
GOWN STRL REUS W/TWL LRG LVL3 (GOWN DISPOSABLE) ×2
KIT OSTEOCOOL BONE ACCESS 10G (MISCELLANEOUS) ×2 IMPLANT
PACK KYPHOPLASTY (MISCELLANEOUS) ×3 IMPLANT
RENTAL RFA  GENERATOR (MISCELLANEOUS)
RENTAL RFA GENERATOR (MISCELLANEOUS) IMPLANT
STRAP SAFETY 5IN WIDE (MISCELLANEOUS) ×3 IMPLANT
SYS CARTRIDGE BONE CEMENT 8ML (SYSTAGENIX WOUND MANAGEMENT) ×3
SYSTEM CARTRIDG BONE CEMNT 8ML (SYSTAGENIX WOUND MANAGEMENT) IMPLANT
SYSTEM GUN BONE FILLER SZ2 (MISCELLANEOUS) ×2 IMPLANT
TRAY KYPHOPAK 15/3 EXPRESS 1ST (MISCELLANEOUS) ×5 IMPLANT
TRAY KYPHOPAK 20/3 EXPRESS 1ST (MISCELLANEOUS) ×3 IMPLANT

## 2019-07-27 NOTE — OR Nursing (Addendum)
Report received from [facility].

## 2019-07-27 NOTE — Discharge Instructions (Addendum)
AMBULATORY SURGERY  DISCHARGE INSTRUCTIONS   1) The drugs that you were given will stay in your system until tomorrow so for the next 24 hours you should not:  A) Drive an automobile B) Make any legal decisions C) Drink any alcoholic beverage   2) You may resume regular meals tomorrow.  Today it is better to start with liquids and gradually work up to solid foods.  You may eat anything you prefer, but it is better to start with liquids, then soup and crackers, and gradually work up to solid foods.   3) Please notify your doctor immediately if you have any unusual bleeding, trouble breathing, redness and pain at the surgery site, drainage, fever, or pain not relieved by medication.    4) Additional Instructions:        Please contact your physician with any problems or Same Day Surgery at [phone], Monday through Friday 6 am to 4 pm, or [facility] number at [phone].Take it easy today and tomorrow.  Resume more normal activities on Thursday.  Remove Band-Aids on Thursday then okay to shower.  Pain medicine as you previously been taking.  Call office if you are having problems.

## 2019-07-27 NOTE — Anesthesia Post-op Follow-up Note (Signed)
Anesthesia QCDR form completed.        

## 2019-07-27 NOTE — OR Nursing (Signed)
Patient changed many times for incontinence. Saturated sheets and clothing multiple times.  Patient and daughter confirmed this in not unusual for her. Small amount of bloody drainage noted on dressing right side.

## 2019-07-27 NOTE — Anesthesia Preprocedure Evaluation (Addendum)
Anesthesia Evaluation  Patient identified by MRN, date of birth, ID band Patient awake    Reviewed: Allergy & Precautions, H&P , NPO status , Patient's Chart, lab work & pertinent test results, reviewed documented beta blocker date and time   History of Anesthesia Complications Negative for: history of anesthetic complications  Airway Mallampati: II  TM Distance: >3 FB Neck ROM: full    Dental  (+) Dental Advidsory Given, Upper Dentures, Lower Dentures   Pulmonary neg pulmonary ROS,    Pulmonary exam normal        Cardiovascular Exercise Tolerance: Good hypertension, (-) angina(-) Past MI and (-) Cardiac Stents + dysrhythmias Atrial Fibrillation (-) Valvular Problems/Murmurs     Neuro/Psych Seizures -, Well Controlled,  PSYCHIATRIC DISORDERS Anxiety Dementia    GI/Hepatic Neg liver ROS, GERD  ,  Endo/Other  negative endocrine ROS  Renal/GU negative Renal ROS  negative genitourinary   Musculoskeletal   Abdominal   Peds  Hematology negative hematology ROS (+)   Anesthesia Other Findings Past Medical History: No date: Anemia     Comment:  vitamin b12 deficiency No date: Anxiety 1975: Breast cancer ([place])     Comment:  right breast with mastectomy No date: COPD (chronic obstructive pulmonary disease) ([facility])     Comment:  PATIENT AND DAUGHTER DENY THIS DIAGNOSIS No date: Dysrhythmia     Comment:  atrial fib...xarelto 07/23/2019: Falls frequently     Comment:  has had several falls in last 2 months.  unknown               etiology No date: GERD (gastroesophageal reflux disease)     Comment:  PATIENT DENIES No date: Hypertension No date: Seizure [facility])     Comment:  last seizure (focal) was years ago No date: Vascular dementia ([place])     Comment:  recently diagnosed. confused at preadmit testing appt.   Reproductive/Obstetrics negative OB ROS                            Anesthesia  Physical Anesthesia Plan  ASA: III  Anesthesia Plan: General   Post-op Pain Management:    Induction: Intravenous  PONV Risk Score and Plan: 3 and Propofol infusion and TIVA  Airway Management Planned: Natural Airway and Nasal Cannula  Additional Equipment:   Intra-op Plan:   Post-operative Plan:   Informed Consent: I have reviewed the patients History and Physical, chart, labs and discussed the procedure including the risks, benefits and alternatives for the proposed anesthesia with the patient or authorized representative who has indicated his/her understanding and acceptance.     Dental Advisory Given  Plan Discussed with: Anesthesiologist, CRNA and Surgeon  Anesthesia Plan Comments:         Anesthesia Quick Evaluation

## 2019-07-27 NOTE — Transfer of Care (Signed)
Immediate Anesthesia Transfer of Care Note  Patient: Norma Newton  Procedure(s) Performed: S1 SACROPLASTY BIOPSY (N/A Back) L3, L4 KYPHOPLASTY (N/A Back)  Patient Location: PACU  Anesthesia Type:General  Level of Consciousness: awake, alert  and drowsy  Airway & Oxygen Therapy: Patient Spontanous Breathing and Patient connected to nasal cannula oxygen  Post-op Assessment: Report given to RN and Post -op Vital signs reviewed and stable  Post vital signs: Reviewed and stable  Last Vitals:  Vitals Value Taken Time  BP 124/99 07/27/19 1749  Temp    Pulse 84 07/27/19 1750  Resp 14 07/27/19 1751  SpO2 98 % 07/27/19 1750  Vitals shown include unvalidated device data.  Last Pain:  Vitals:   07/27/19 1419  TempSrc: Oral  PainSc: 3       Patients Stated Pain Goal: 1 ([id number])  Complications: No apparent anesthesia complications

## 2019-07-27 NOTE — H&P (Signed)
Reviewed paper H+P, will be scanned into chart. No changes noted.  

## 2019-07-27 NOTE — Op Note (Signed)
Date July 27, 2019  time 5:50 PM   PATIENT:  Norma Newton   PRE-OPERATIVE DIAGNOSIS:  closed wedge compression fracture of L3 and L4 and lesion S1   POST-OPERATIVE DIAGNOSIS:  closed wedge compression fracture of L3 and L4, S1 lesion   PROCEDURE:  Procedure(s): KYPHOPLASTY L3 and L4 with sacral biopsy and sacral plasty  SURGEON: Laurene Footman, MD   ASSISTANTS: None   ANESTHESIA:   local and MAC   EBL:  No intake/output data recorded.   BLOOD ADMINISTERED:none   DRAINS: none    LOCAL MEDICATIONS USED:  MARCAINE    and XYLOCAINE    SPECIMEN:   Sacrum, L3 and L4 vertebral body biopsies   DISPOSITION OF SPECIMEN:  Pathology   COUNTS:  YES   TOURNIQUET:  * No tourniquets in log *   IMPLANTS: Bone cement   DICTATION: .Dragon Dictation  patient was brought to the operating room and after adequate anesthesia was obtained the patient was placed prone.  C arm was brought in in good visualization of the affected levels obtained on both AP and lateral projections.  After patient identification and timeout procedures were completed, local anesthetic was infiltrated with 10 cc 1% Xylocaine infiltrated subcutaneously.  This is done the area on the right side of the of L3, left side of L4 and both sides at S1 The back was then prepped and draped in the usual sterile manner and repeat timeout procedure carried out.  A spinal needle was brought down to the pedicle on the right side of  L3 and a 50-50 mix of 1% Xylocaine half percent Sensorcaine with epinephrine total of 20 cc injected.  This was repeated on the left side at L4 and at the S1 pedicles as well. After allowing this to set a small incision was made and the trocar was advanced into the sacrum body on the right and left and a short axis approach..  Biopsy was obtained Drilling was carried out and cement was then placed when it is the appropriate consistency and had good fill at the appropriate level without extravasation..    After the cement had set the trochars were removed and permanent C-arm views obtained.  The wounds were closed with Dermabond followed by Band-Aid   PLAN OF CARE: Discharge to home after PACU   PATIENT DISPOSITION:  PACU - hemodynamically stable.

## 2019-07-27 NOTE — Anesthesia Postprocedure Evaluation (Signed)
Anesthesia Post Note  Patient: Norma Newton  Procedure(s) Performed: S1 SACROPLASTY BIOPSY (N/A Back) L3, L4 KYPHOPLASTY (N/A Back)  Patient location during evaluation: PACU Anesthesia Type: General Level of consciousness: awake and alert Pain management: pain level controlled Vital Signs Assessment: post-procedure vital signs reviewed and stable Respiratory status: spontaneous breathing, nonlabored ventilation, respiratory function stable and patient connected to nasal cannula oxygen Cardiovascular status: blood pressure returned to baseline and stable Postop Assessment: no apparent nausea or vomiting Anesthetic complications: no     Last Vitals:  Vitals:   07/27/19 1806 07/27/19 1906  BP: (!) 143/94 (!) 166/88  Pulse: (!) 101   Resp: (!) 0 18  Temp:  36.8 C  SpO2: 97% 98%    Last Pain:  Vitals:   07/27/19 1906  TempSrc:   PainSc: 0-No pain                 Tequilla Cousineau S

## 2019-07-27 NOTE — OR Nursing (Signed)
Patient given a fleets enema with good results.  States she moved her bowels yesterday also.

## 2019-07-28 ENCOUNTER — Encounter: Payer: Self-pay | Admitting: Orthopedic Surgery

## 2019-07-30 LAB — SURGICAL PATHOLOGY

## 2019-09-01 ENCOUNTER — Emergency Department: Payer: Medicare PPO

## 2019-09-01 ENCOUNTER — Other Ambulatory Visit: Payer: Self-pay

## 2019-09-01 ENCOUNTER — Emergency Department
Admission: EM | Admit: 2019-09-01 | Discharge: 2019-09-02 | Disposition: A | Discharge status: Home / Self Care | Payer: Medicare PPO | Attending: Emergency Medicine | Admitting: Emergency Medicine

## 2019-09-01 ENCOUNTER — Encounter: Payer: Self-pay | Admitting: Emergency Medicine

## 2019-09-01 ENCOUNTER — Ambulatory Visit (INDEPENDENT_AMBULATORY_CARE_PROVIDER_SITE_OTHER)
Admission: EM | Admit: 2019-09-01 | Discharge: 2019-09-01 | Disposition: A | Discharge status: Other Acute Inpatient Hospital | Payer: Medicare PPO | Source: Home / Self Care | Attending: Family Medicine | Admitting: Family Medicine

## 2019-09-01 DIAGNOSIS — Z853 Personal history of malignant neoplasm of breast: Secondary | ICD-10-CM | POA: Diagnosis not present

## 2019-09-01 DIAGNOSIS — R519 Headache, unspecified: Secondary | ICD-10-CM | POA: Insufficient documentation

## 2019-09-01 DIAGNOSIS — I4891 Unspecified atrial fibrillation: Secondary | ICD-10-CM | POA: Diagnosis not present

## 2019-09-01 DIAGNOSIS — G44201 Tension-type headache, unspecified, intractable: Secondary | ICD-10-CM

## 2019-09-01 DIAGNOSIS — Z7901 Long term (current) use of anticoagulants: Secondary | ICD-10-CM | POA: Diagnosis not present

## 2019-09-01 DIAGNOSIS — R531 Weakness: Secondary | ICD-10-CM

## 2019-09-01 DIAGNOSIS — H5702 Anisocoria: Secondary | ICD-10-CM

## 2019-09-01 DIAGNOSIS — Z79899 Other long term (current) drug therapy: Secondary | ICD-10-CM | POA: Insufficient documentation

## 2019-09-01 DIAGNOSIS — I10 Essential (primary) hypertension: Secondary | ICD-10-CM | POA: Diagnosis not present

## 2019-09-01 DIAGNOSIS — E871 Hypo-osmolality and hyponatremia: Secondary | ICD-10-CM

## 2019-09-01 DIAGNOSIS — R4182 Altered mental status, unspecified: Secondary | ICD-10-CM | POA: Insufficient documentation

## 2019-09-01 LAB — CBC
HCT: 36.8 % (ref 36.0–46.0)
Hemoglobin: 12.2 g/dL (ref 12.0–15.0)
MCH: 29.8 pg (ref 26.0–34.0)
MCHC: 33.2 g/dL (ref 30.0–36.0)
MCV: 90 fL (ref 80.0–100.0)
Platelets: 258 10*3/uL (ref 150–400)
RBC: 4.09 MIL/uL (ref 3.87–5.11)
RDW: 12 % (ref 11.5–15.5)
WBC: 5.7 10*3/uL (ref 4.0–10.5)
nRBC: 0 % (ref 0.0–0.2)

## 2019-09-01 LAB — DIFFERENTIAL
Abs Immature Granulocytes: 0.02 10*3/uL (ref 0.00–0.07)
Basophils Absolute: 0 10*3/uL (ref 0.0–0.1)
Basophils Relative: 0 %
Eosinophils Absolute: 0 10*3/uL (ref 0.0–0.5)
Eosinophils Relative: 0 %
Immature Granulocytes: 0 %
Lymphocytes Relative: 17 %
Lymphs Abs: 1 10*3/uL (ref 0.7–4.0)
Monocytes Absolute: 0.7 10*3/uL (ref 0.1–1.0)
Monocytes Relative: 12 %
Neutro Abs: 3.9 10*3/uL (ref 1.7–7.7)
Neutrophils Relative %: 71 %

## 2019-09-01 LAB — COMPREHENSIVE METABOLIC PANEL
ALT: 13 U/L (ref 0–44)
AST: 26 U/L (ref 15–41)
Albumin: 4.2 g/dL (ref 3.5–5.0)
Alkaline Phosphatase: 89 U/L (ref 38–126)
Anion gap: 13 (ref 5–15)
BUN: 13 mg/dL (ref 8–23)
CO2: 26 mmol/L (ref 22–32)
Calcium: 9.8 mg/dL (ref 8.9–10.3)
Chloride: 88 mmol/L — ABNORMAL LOW (ref 98–111)
Creatinine, Ser: 0.54 mg/dL (ref 0.44–1.00)
GFR calc Af Amer: >60 mL/min (ref >60)
GFR calc non Af Amer: >60 mL/min (ref >60)
Glucose, Bld: 107 mg/dL — ABNORMAL HIGH (ref 70–99)
Potassium: 4.6 mmol/L (ref 3.5–5.1)
Sodium: 127 mmol/L — ABNORMAL LOW (ref 135–145)
Total Bilirubin: 0.7 mg/dL (ref 0.3–1.2)
Total Protein: 7.7 g/dL (ref 6.5–8.1)

## 2019-09-01 LAB — PROTIME-INR
INR: 1 (ref 0.8–1.2)
Prothrombin Time: 13.1 seconds (ref 11.4–15.2)

## 2019-09-01 LAB — GLUCOSE, CAPILLARY: Glucose-Capillary: 102 mg/dL — ABNORMAL HIGH (ref 70–99)

## 2019-09-01 LAB — APTT: aPTT: 26 seconds (ref 24–36)

## 2019-09-01 MED ORDER — GADOBUTROL 1 MMOL/ML IV SOLN
6.0000 mL | Freq: Once | INTRAVENOUS | Status: AC | PRN
  Administered 2019-09-01: 6 mL via INTRAVENOUS

## 2019-09-01 MED ORDER — SODIUM CHLORIDE 0.9 % IV BOLUS
500.0000 mL | Freq: Once | INTRAVENOUS | Status: AC
  Administered 2019-09-01: 500 mL via INTRAVENOUS

## 2019-09-01 MED ORDER — SODIUM CHLORIDE 0.9% FLUSH
3.0000 mL | Freq: Once | INTRAVENOUS | Status: AC
  Administered 2019-09-01: 3 mL via INTRAVENOUS

## 2019-09-01 NOTE — ED Notes (Signed)
Lattie Haw (Pt daughter) called and is in parking lot with patient's husband. Husband is currently on way in to sit with patient.

## 2019-09-01 NOTE — Discharge Instructions (Addendum)
Your sodium level was low at 127.  You should have this rechecked in 2 days.  It Is unclear what is causing the pupil asymmetry.

## 2019-09-01 NOTE — ED Triage Notes (Signed)
Daughter stated patient told her she "didn't fell well." She c/o feeling tired and has a headache that started this morning. Daughter states when she went to her house she said the patient was not coherent. Nurse came out and checked her VS and stated one of her pupils was dilated and she could have possibly had a TIA. Nurse advised daughter to take patient to the ER but daughter stated she didn't want to spend the whole night in the ER so she came to UC.

## 2019-09-01 NOTE — ED Provider Notes (Signed)
[facility] Emergency Department Provider Note  ____________________________________________   First MD Initiated Contact with Patient 09/01/19 2009     (approximate)  I have reviewed the triage vital signs and the nursing notes.   HISTORY  Chief Complaint Headache    HPI PATIA BREIDINGER is a 85 y.o. female with A. fib on Xarelto, seizures years ago, vascular dementia who presented for headaches.  Patient stated that she did not feel well.  Pupils were noted to be asymmetric by a nurse at urgent care and sent here for further evaluation.  Patient says her weakness has been going on for 2 days, constant, nothing makes it better, nothing makes it worse.  She has had 1 fall since having back surgery.  Has some chronic back pain after the surgery.  Denies any urinary continence, leg weakness, leg numbness, saddle anesthesia.  Past Medical History:  Diagnosis Date  . Anemia    vitamin b12 deficiency  . Anxiety   . Breast cancer ([place]) 1975   right breast with mastectomy  . COPD (chronic obstructive pulmonary disease) (HCC)    PATIENT AND DAUGHTER DENY THIS DIAGNOSIS  . Dysrhythmia    atrial fib...xarelto  . Falls frequently 07/23/2019   has had several falls in last 2 months.  unknown etiology  . GERD (gastroesophageal reflux disease)    PATIENT DENIES  . Hypertension   . Seizure ([place])    last seizure (focal) was years ago  . Vascular dementia [facility])    recently diagnosed. confused at preadmit testing appt.    There are no active problems to display for this patient.   Past Surgical History:  Procedure Laterality Date  . ABDOMINAL HYSTERECTOMY    . APPENDECTOMY    . EYE SURGERY     cataract surgery  . KYPHOPLASTY N/A 07/27/2019   Procedure: L3, L4 KYPHOPLASTY;  Surgeon: Hessie Knows, MD;  Location: [facility] ORS;  Service: Orthopedics;  Laterality: N/A;  . MASTECTOMY Right 1975   positive  . SACROPLASTY N/A 07/27/2019   Procedure: S1 SACROPLASTY  BIOPSY;  Surgeon: Hessie Knows, MD;  Location: [facility] ORS;  Service: Orthopedics;  Laterality: N/A;    Prior to Admission medications   Medication Sig Start Date End Date Taking? Authorizing Provider  acetaminophen (TYLENOL) 650 MG CR tablet Take 1,300 mg by mouth every 8 (eight) hours as needed for pain.    [provider]  ASPERCREME LIDOCAINE EX Apply 1 application topically daily as needed (pain).    [provider]  divalproex (DEPAKOTE) 250 MG DR tablet Take 250 mg by mouth 3 (three) times daily. 05/07/19   [provider]  fluticasone (VERAMYST) 27.5 MCG/SPRAY nasal spray Place 2 sprays into the nose daily.    [provider]  loratadine (CLARITIN) 10 MG tablet Take 10 mg by mouth daily.    [provider]  Melatonin 3 MG CAPS Take 3 mg by mouth at bedtime.    [provider]  memantine (NAMENDA) 5 MG tablet Take 5 mg by mouth 2 (two) times daily.    [provider]  metoprolol tartrate (LOPRESSOR) 25 MG tablet Take 25 mg by mouth 2 (two) times daily.    [provider]  Multiple Vitamin (MULTIVITAMIN WITH MINERALS) TABS tablet Take 1 tablet by mouth daily.    [provider]  rivaroxaban (XARELTO) 10 MG TABS tablet Take 10 mg by mouth daily with supper.    [provider]  traMADol Veatrice Bourbon) 50  MG tablet Take 25-50 mg by mouth every 6 (six) hours as needed for pain. 07/16/19   [provider]    Allergies Doxycycline calcium  Family History  Problem Relation Age of Onset  . Breast cancer Sister   . Breast cancer Sister     Social History Social History   Tobacco Use  . Smoking status: Never Smoker  . Smokeless tobacco: Never Used  Substance Use Topics  . Alcohol use: Not Currently    Frequency: Never  . Drug use: Never      Review of Systems Constitutional: No fever/chills Eyes: No visual changes.  Pupil asymmetry ENT: No sore throat. Cardiovascular: Denies chest pain.  Respiratory: Denies shortness of breath. Gastrointestinal: No abdominal pain.  No nausea, no vomiting.  No diarrhea.  No constipation. Genitourinary: Negative for dysuria. Musculoskeletal: Negative for back pain. Skin: Negative for rash. Neurological: Negative for headaches, focal weakness or numbness.  Generalized weakness  All other ROS negative ____________________________________________   PHYSICAL EXAM:  VITAL SIGNS: ED Triage Vitals  Enc Vitals Group     BP 09/01/19 1833 (!) 173/87     Pulse Rate 09/01/19 1833 94     Resp 09/01/19 1833 19     Temp 09/01/19 1833 98.2 F (36.8 C)     Temp src --      SpO2 09/01/19 1833 100 %     Weight 09/01/19 1834 140 lb (63.5 kg)     Height 09/01/19 1834 5\' 2"  (1.575 m)     Head Circumference --      Peak Flow --      Pain Score 09/01/19 1834 6     Pain Loc --      Pain Edu? --      Excl. in [place]? --     Constitutional: Alert and oriented. Well appearing and in no acute distress. Eyes: Conjunctivae are normal.  Pupil in the right is 5 nonreactive.  Pupil on the left is 2 mm and reactive Head: Atraumatic. Nose: No congestion/rhinnorhea. Mouth/Throat: Mucous membranes are moist.   Neck: No stridor. Trachea Midline. FROM Cardiovascular: Normal rate, regular rhythm. Grossly normal heart sounds.  Good peripheral circulation. Respiratory: Normal respiratory effort.  No retractions. Lungs CTAB. Gastrointestinal: Soft and nontender. No distention. No abdominal bruits.  Musculoskeletal: No lower extremity tenderness nor edema.  No joint effusions.  5 out of 5 strength in her lower extremity Neurologic: Other than the pupil asymmetry cranial nerves II through XII are intact. Skin:  Skin is warm, dry and intact. No rash noted. Psychiatric: Mood and affect are normal. Speech and behavior are normal. GU: Deferred   ____________________________________________   LABS (all labs ordered are listed, but only abnormal results are displayed)   Labs Reviewed  COMPREHENSIVE METABOLIC PANEL - Abnormal; Notable for the following components:      Result Value   Sodium 127 (*)    Chloride 88 (*)    Glucose, Bld 107 (*)    All other components within normal limits  SARS CORONAVIRUS 2 (TAT 6-24 HRS)  CBC  DIFFERENTIAL  PROTIME-INR  APTT  URINALYSIS, ROUTINE W REFLEX MICROSCOPIC  CBG MONITORING, ED  I-STAT CREATININE, ED   ____________________________________________   ED ECG REPORT I, Vanessa [place], the attending physician, personally viewed and interpreted this ECG.  EKG is atrial fibrillation rate of 87, no ST elevation, no T wave inversion, normal intervals ____________________________________________  RADIOLOGY   Official radiology report(s): Ct Head Wo Contrast  Result Date:  09/01/2019 CLINICAL DATA:  Headache EXAM: CT HEAD WITHOUT CONTRAST TECHNIQUE: Contiguous axial images were obtained from the base of the skull through the vertex without intravenous contrast. COMPARISON:  CT head dated 07/05/2019 FINDINGS: Brain: No evidence of acute infarction, hemorrhage, hydrocephalus, extra-axial collection or mass lesion/mass effect. There is moderate to severe cerebral volume loss with associated ex vacuo dilatation. Periventricular white matter hypoattenuation likely represents chronic small vessel ischemic disease. Vascular: There are vascular calcifications in the carotid siphons. Skull: Normal. Negative for fracture or focal lesion. Sinuses/Orbits: No acute finding. Other: None. IMPRESSION: No acute intracranial process. Electronically Signed   By: Zerita Boers M.D.   On: 09/01/2019 19:11    ____________________________________________   PROCEDURES  Procedure(s) performed (including Critical Care):  Procedures   ____________________________________________   INITIAL IMPRESSION / ASSESSMENT AND PLAN / ED COURSE  LAZARIAH SCOLLON was evaluated in Emergency Department on 09/01/2019 for the symptoms described in  the history of present illness. She was evaluated in the context of the global COVID-19 pandemic, which necessitated consideration that the patient might be at risk for infection with the SARS-CoV-2 virus that causes COVID-19. Institutional protocols and algorithms that pertain to the evaluation of patients at risk for COVID-19 are in a state of rapid change based on information released by regulatory bodies including the CDC and federal and state organizations. These policies and algorithms were followed during the patient's care in the ED.    Patient is an 85 year old who presents with generalized weakness and pupil asymmetry.  CT head was ordered to evaluate for epidural subdural hematoma.  Will get labs evaluate for electrolyte abnormalities, AKI.  CT scan was negative.  Will get MRIs to evaluate for tumor, infarction.   Sodium level low at 127 at her baseline is around 130.   Admitted patient to the hospitalist team and pts states she does not want to be admitted.  They are ready for patient an MRI.  Patient is ok getting MRI.  Patient said that afterward she like to be discharged home.  I had a lengthy discussion with patient as well as daughter who was at bedside that is unclear exactly what is causing patient's weakness and that I would recommend admission for her low sodiums to be further corrected.  The understand the risk of leaving including death and permanent disability.  They have the capacity to make this decision.  They are going to follow-up with her primary care doctor.  They are okay with staying to get the MRIs done and if the MRI show anything emergent the oncoming doctor will discuss with them to try to convince him to stay.  Otherwise if MRIs are negative and patient will be discharged home.    ____________________________________________   FINAL CLINICAL IMPRESSION(S) / ED DIAGNOSES   Final diagnoses:  Weakness  Hyponatremia  Pupil asymmetry      MEDICATIONS GIVEN  DURING THIS VISIT:  Medications  sodium chloride flush (NS) 0.9 % injection 3 mL (has no administration in time range)  sodium chloride 0.9 % bolus 500 mL (has no administration in time range)  gadobutrol (GADAVIST) 1 MMOL/ML injection 6 mL (6 mLs Intravenous Contrast Given 09/01/19 2325)     ED Discharge Orders    None       Note:  This document was prepared using Dragon voice recognition software and may include unintentional dictation errors.   Vanessa Gurley, MD 09/01/19 [phone]

## 2019-09-01 NOTE — ED Notes (Addendum)
Patient transported to MRI 

## 2019-09-01 NOTE — ED Notes (Signed)
Patient alert and oriented complaining of being confused last night with her medications. Patient states she went to urgent care and they advised she should be seen in ER. Patient's right pupil is dilated and does not move with light which is new per patient.

## 2019-09-01 NOTE — ED Triage Notes (Addendum)
Pt comes via [facility] from home with c/o headache and dilated pupil to right eye.  Pt states yesterday she didn't feel well. Pt states she is on so much medication. Pt states the headache started yesterday and was dull. Pt denies any blurry vision. Pt states she did fall a couple weeks ago, but denies any LOC or hitting head. Pt is on blood thinners.  Pt went to [place] to be evaluated and was advised to come here for further tests.  EMS states pt's BP-189/95.   EMS also reports mini neuro negative.  Pt states bad headache across forehead. Pt is A&OX4. Pt also states weakness and that she has been under a lot of stress.  Mini neuro negative, VAN negative in triage.

## 2019-09-01 NOTE — ED Provider Notes (Signed)
[facility] ____________________________________________  Time seen: Approximately 5:39 PM  I have reviewed the triage vital signs and the nursing notes.   HISTORY  Chief Complaint Weakness  Difficult to obtain history from patient and daughter.  HPI Norma Newton is a 85 y.o. female history of chronic atrial fibrillation, vascular dementia, seizure and COPD presenting for evaluation of "being run down" with headache.  Daughter approximates since 12 noon today her mom has not been acting her normal self and has been somewhat out of it stating that she did not feel well with complaints of a headache.  Daughter states when she got to her mom's house this afternoon she noticed her mom's eyes did not look normal and she seemed more unsteady.  States at that point she called the home health nurse who recommended patient to be evaluated in the ER.  Patient reports moderate generalized headache and somewhat of a blurry vision but unable to further describe vision changes.  Denies paresthesias, chest pain, shortness of breath, unilateral weakness or head injury.  No dysuria.  Overall has continued to eat and drink normally.  Denies fevers.   Toni Arthurs, NP: PCP   Past Medical History:  Diagnosis Date  . Anemia    vitamin b12 deficiency  . Anxiety   . Breast cancer ([place]) 1975   right breast with mastectomy  . COPD (chronic obstructive pulmonary disease) (HCC)    PATIENT AND DAUGHTER DENY THIS DIAGNOSIS  . Dysrhythmia    atrial fib...xarelto  . Falls frequently 07/23/2019   has had several falls in last 2 months.  unknown etiology  . GERD (gastroesophageal reflux disease)    PATIENT DENIES  . Hypertension   . Seizure ([place])    last seizure (focal) was years ago  . Vascular dementia [facility])    recently diagnosed. confused at preadmit testing appt.    There are no active problems to display for this patient.   Past Surgical History:  Procedure Laterality Date  .  ABDOMINAL HYSTERECTOMY    . APPENDECTOMY    . EYE SURGERY     cataract surgery  . KYPHOPLASTY N/A 07/27/2019   Procedure: L3, L4 KYPHOPLASTY;  Surgeon: Hessie Knows, MD;  Location: [facility] ORS;  Service: Orthopedics;  Laterality: N/A;  . MASTECTOMY Right 1975   positive  . SACROPLASTY N/A 07/27/2019   Procedure: S1 SACROPLASTY BIOPSY;  Surgeon: Hessie Knows, MD;  Location: [facility] ORS;  Service: Orthopedics;  Laterality: N/A;     No current facility-administered medications for this encounter.   Current Outpatient Medications:  .  acetaminophen (TYLENOL) 650 MG CR tablet, Take 1,300 mg by mouth every 8 (eight) hours as needed for pain., Disp: , Rfl:  .  ASPERCREME LIDOCAINE EX, Apply 1 application topically daily as needed (pain)., Disp: , Rfl:  .  divalproex (DEPAKOTE) 250 MG DR tablet, Take 250 mg by mouth 3 (three) times daily., Disp: , Rfl:  .  fluticasone (VERAMYST) 27.5 MCG/SPRAY nasal spray, Place 2 sprays into the nose daily., Disp: , Rfl:  .  loratadine (CLARITIN) 10 MG tablet, Take 10 mg by mouth daily., Disp: , Rfl:  .  Melatonin 3 MG CAPS, Take 3 mg by mouth at bedtime., Disp: , Rfl:  .  memantine (NAMENDA) 5 MG tablet, Take 5 mg by mouth 2 (two) times daily., Disp: , Rfl:  .  metoprolol tartrate (LOPRESSOR) 25 MG tablet, Take 25 mg by mouth 2 (two) times daily., Disp: , Rfl:  .  Multiple Vitamin (MULTIVITAMIN WITH MINERALS) TABS tablet, Take 1 tablet by mouth daily., Disp: , Rfl:  .  rivaroxaban (XARELTO) 10 MG TABS tablet, Take 10 mg by mouth daily with supper., Disp: , Rfl:  .  traMADol (ULTRAM) 50 MG tablet, Take 25-50 mg by mouth every 6 (six) hours as needed for pain., Disp: , Rfl:   Allergies Doxycycline calcium  Family History  Problem Relation Age of Onset  . Breast cancer Sister   . Breast cancer Sister     Social History Social History   Tobacco Use  . Smoking status: Never Smoker  . Smokeless tobacco: Never Used  Substance Use Topics  . Alcohol use: Not  Currently    Frequency: Never  . Drug use: Never    Review of Systems Constitutional: No fever/chills Eyes: Positive eye changes. Cardiovascular: Denies chest pain. Respiratory: Denies shortness of breath. Gastrointestinal: No abdominal pain.   Genitourinary: Negative for dysuria. Skin: Negative for rash. Neurological: Positive headache.  ____________________________________________   PHYSICAL EXAM:  VITAL SIGNS: ED Triage Vitals  Enc Vitals Group     BP 09/01/19 1715 (!) 151/83     Pulse Rate 09/01/19 1715 95     Resp 09/01/19 1715 18     Temp 09/01/19 1715 98.7 F (37.1 C)     Temp Source 09/01/19 1715 Oral     SpO2 09/01/19 1715 100 %     Weight 09/01/19 1713 140 lb (63.5 kg)     Height --      Head Circumference --      Peak Flow --      Pain Score 09/01/19 1712 5     Pain Loc --      Pain Edu? --      Excl. in [place]? --     Constitutional: Alert and oriented. Well appearing and in no acute distress. Eyes: Conjunctivae are normal.  Asymmetrical pupils right>left. ENT      Head: Normocephalic and atraumatic. Cardiovascular: Normal rate, regular rhythm. Grossly normal heart sounds.  Good peripheral circulation. Respiratory: Normal respiratory effort without tachypnea nor retractions. Breath sounds are clear and equal bilaterally. No wheezes, rales, rhonchi. Gastrointestinal: Soft and nontender.  Musculoskeletal: 5/5 strength to bilateral upper and lower extremities. Neurologic:  Normal speech and language.  Negative pronator drift.  No paresthesias.5/5 strength to bilateral upper and lower extremities. Skin:  Skin is warm, dry and intact. No rash noted. Psychiatric: Mood and affect are normal. Speech and behavior are normal. Patient exhibits appropriate insight and judgment   ___________________________________________   LABS (all labs ordered are listed, but only abnormal results are displayed)  Labs Reviewed  GLUCOSE, CAPILLARY - Abnormal; Notable for the  following components:      Result Value   Glucose-Capillary 102 (*)    All other components within normal limits  CBG MONITORING, ED   ____________________________________________  EKG  ED ECG REPORT I, Marylene Land, the attending provider, personally viewed and interpreted this ECG.   Date: 09/01/2019  EKG Time: 1745  Rate: 87  Rhythm: atrial fibrillation   Axis: normal  ST&T Change: none   INITIAL IMPRESSION / ASSESSMENT AND PLAN / ED COURSE  Pertinent labs & imaging results that were available during my care of the patient were reviewed by me and considered in my medical decision making (see chart for details).  Pleasant patient with multiple comorbidities presenting for abnormal behavior, headache and asymmetrical pupils.  Concern for acute neurological event.  Recommend further evaluation emergency  room at this time by EMS.  EMS called.  Patient stable at discharge.  Patient and daughter agreed to this plan. ____________________________________________   FINAL CLINICAL IMPRESSION(S) / ED DIAGNOSES  Final diagnoses:  Pupil asymmetry  Acute intractable headache, unspecified headache type     ED Discharge Orders    None       Note: This dictation was prepared with Dragon dictation along with smaller phrase technology. Any transcriptional errors that result from this process are unintentional.         Marylene Land, NP 09/01/19 1809

## 2019-09-02 NOTE — ED Provider Notes (Signed)
I assumed care of the patient from Dr. Jari Pigg at 11:00 PM.  MRI revealed:   CLINICAL DATA:  Initial evaluation for unexplained acute altered mental status.  EXAM: MRI HEAD WITHOUT CONTRAST  MRA HEAD WITHOUT CONTRAST  MRA NECK WITHOUT AND WITH CONTRAST  TECHNIQUE: Multiplanar, multiecho pulse sequences of the brain and surrounding structures were obtained without intravenous contrast. Angiographic images of the Circle of Willis were obtained using MRA technique without intravenous contrast. Angiographic images of the neck were obtained using MRA technique without and with intravenous contrast. Carotid stenosis measurements (when applicable) are obtained utilizing NASCET criteria, using the distal internal carotid diameter as the denominator.  CONTRAST:  58mL GADAVIST GADOBUTROL 1 MMOL/ML IV SOLN  COMPARISON:  None.  FINDINGS: MRI HEAD FINDINGS  Brain: Examination mildly degraded by motion artifact.  Generalized age-related cerebral atrophy. Patchy and confluent T2/FLAIR hyperintensity within the periventricular and deep white matter both cerebral hemispheres, most consistent with chronic small vessel ischemic disease, moderate in nature.  No abnormal foci of restricted diffusion to suggest acute or subacute ischemia. Gray-white matter differentiation maintained. No encephalomalacia to suggest chronic cortical infarction. No foci of susceptibility artifact to suggest acute or chronic intracranial hemorrhage.  No mass lesion, midline shift or mass effect no hydrocephalus. No extra-axial fluid collection. Diffuse ventricular prominence related to global parenchymal volume loss without hydrocephalus. No extra-axial fluid collection.  Pituitary gland suprasellar region normal. Midline structures intact.  Vascular: Major intracranial vascular flow voids are maintained.  Skull and upper cervical spine: Craniocervical junction within normal limits. Visualized  upper cervical spine within normal limits. Bone marrow signal intensity normal. No scalp soft tissue abnormality.  Sinuses/Orbits: Patient status post ocular lens replacement on the right. Globes and orbital soft tissues demonstrate no acute finding. Mild chronic mucosal thickening noted within the maxillary sinuses bilaterally. Paranasal sinuses are otherwise clear. Trace bilateral mastoid effusions, of doubtful significance.  Other: None.  MRA HEAD FINDINGS  ANTERIOR CIRCULATION:  Examination mildly degraded by motion artifact.  Distal cervical segments of the internal carotid arteries are widely patent with symmetric antegrade flow. Petrous, cavernous, and supraclinoid ICAs widely patent bilaterally. A1 segments, anterior communicating artery common anterior cerebral arteries widely patent bilaterally.  Right M1 widely patent. Normal right MCA bifurcation. Distal right MCA branches well perfused. Distal small vessel atheromatous irregularity noted.  Left M1 duplicated and/or bifurcates early. Short-segment moderate left M2 stenosis, superior division (series 1034, image 11). Left MCA branches otherwise well perfused to their distal aspects. Distal small vessel atheromatous irregularity.  POSTERIOR CIRCULATION:  Dominant right vertebral artery widely patent to the vertebrobasilar junction. Left vertebral artery diminutive, and widely patent to the left PICA, and then subsequently occludes. Both picas patent bilaterally. Basilar widely patent to its distal aspect without stenosis. Superior cerebral arteries patent proximally. Right PCA supplied via the basilar. Predominant fetal type origin of the left PCA. Short-segment moderate proximal right P2 stenosis (series 1045, image 7). PCAs otherwise well perfused to their distal aspects.  No intracranial aneurysm.  MRA NECK FINDINGS  Source images reviewed.  Visualized aortic arch of normal caliber with  normal branch pattern. No hemodynamically significant stenosis seen about the origin of the great vessels. Visualized subclavian arteries widely patent.  Right common carotid artery widely patent from its origin to the bifurcation without stenosis. Mild atheromatous irregularity about the right bifurcation without significant narrowing. Right ICA widely patent distally to the skull base without stenosis, dissection or occlusion.  Left common carotid artery widely patent from its  origin to the bifurcation. No significant atheromatous narrowing about the left bifurcation. Left ICA mildly tortuous but widely patent distally to the skull base without stenosis, dissection or occlusion.  Dominant right vertebral artery arises from the right subclavian artery and is widely patent within the neck. Left vertebral artery diffusely hypoplastic and arises directly from the aortic arch. Left vertebral artery patent within the neck without flow-limiting stenosis or other abnormality.  IMPRESSION: MRI HEAD IMPRESSION:  1. No acute intracranial abnormality. 2. Generalized age-related cerebral atrophy with moderate chronic microvascular ischemic disease.  MRA HEAD IMPRESSION:  1. Negative intracranial MRA for acute large vessel territory infarct. 2. 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 Dominant left vertebral artery widely patent. 3. Mild scattered intracranial atherosclerotic change elsewhere, with associated short-segment moderate left M2 and right P2 stenoses as above. 4. No intracranial aneurysm.  MRA NECK IMPRESSION:  Negative MRA of the neck with wide patency of both carotid artery systems and vertebral arteries. No hemodynamically significant stenosis or other acute vascular abnormality.   Electronically Signed   By: Jeannine Boga M.D.   On: 09/02/2019 00:45    As per Dr. Maryelizabeth Kaufmann recommendation   Result  History I spoke with the patient and her daughter at length regarding MRI findings. I reiterated what Dr. Nickolas Madrid said in regards to admission however the patient is adamantly opposed.    Gregor Hams, MD 09/02/19 [phone]

## 2019-12-13 DIAGNOSIS — E538 Deficiency of other specified B group vitamins: Secondary | ICD-10-CM | POA: Diagnosis not present

## 2020-01-02 IMAGING — CT CT HEAD WITHOUT AND WITH CONTRAST
1 of 2 series · 13 of 30 positions shown, 17 images · IV contrast (omnipaque)
Comparison: None.

CLINICAL DATA: Memory difficulty.  Generalized weakness

EXAM:
CT HEAD WITHOUT AND WITH CONTRAST
TECHNIQUE: Contiguous axial images were obtained from the base of the skull
through the vertex without and with intravenous contrast
CONTRAST:  75mL OMNIPAQUE IOHEXOL 300 MG/ML  SOLN

[Series 2: head wo · axial · 0.39mm/px · z∈[+252,+372]mm · 13 of 30 slices shown, 17 images]
[im 3/30  brain]
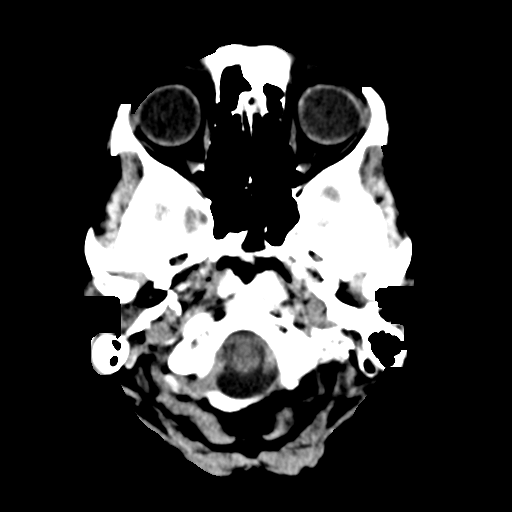
[im 3/30  bone]
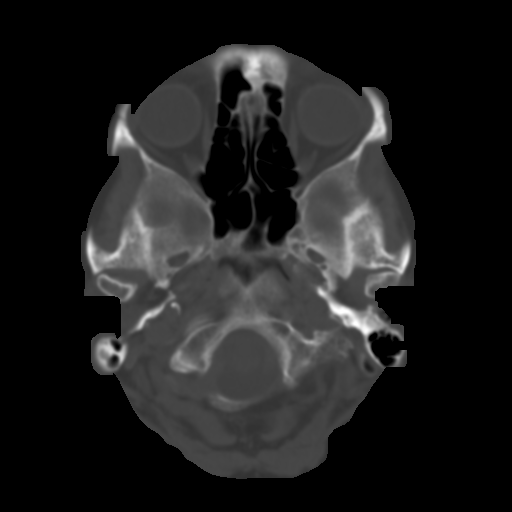
[im 5/30  brain]
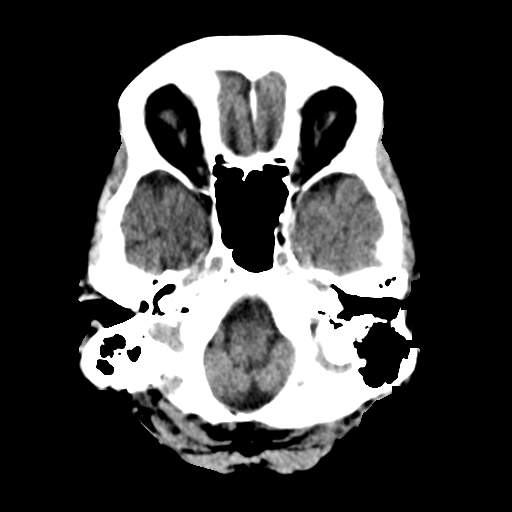
[im 7/30  brain]
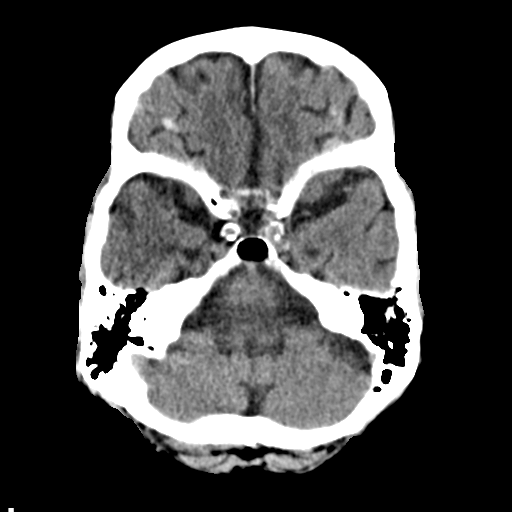
[im 9/30  brain]
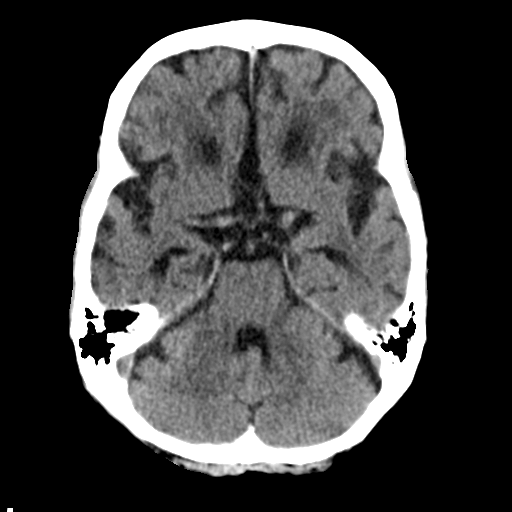
[im 11/30  brain]
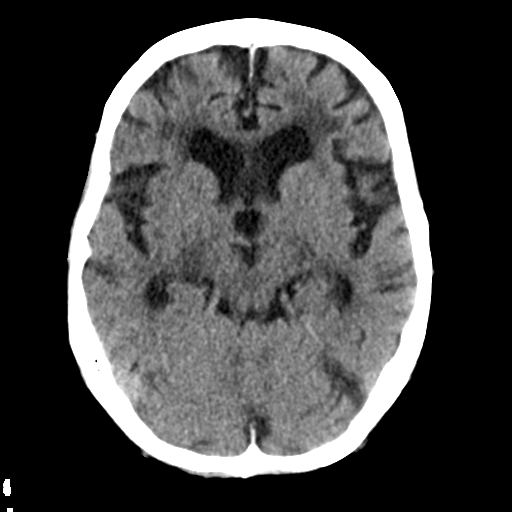
[im 11/30  bone]
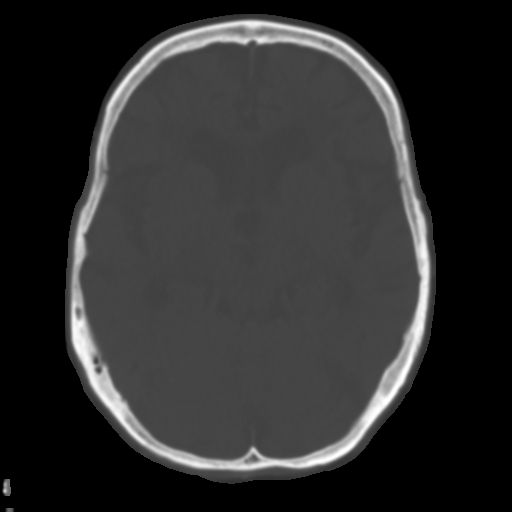
[im 13/30  brain]
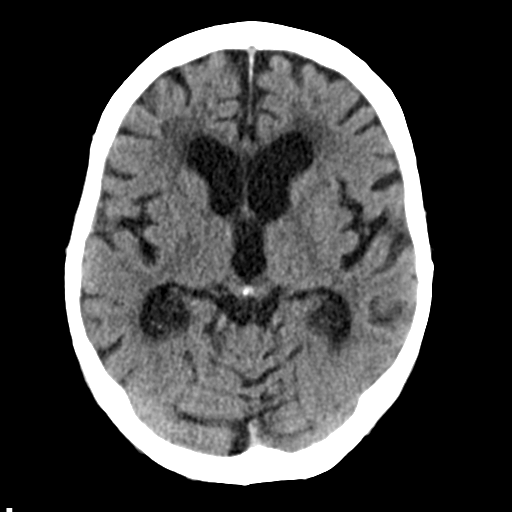
[im 15/30  brain]
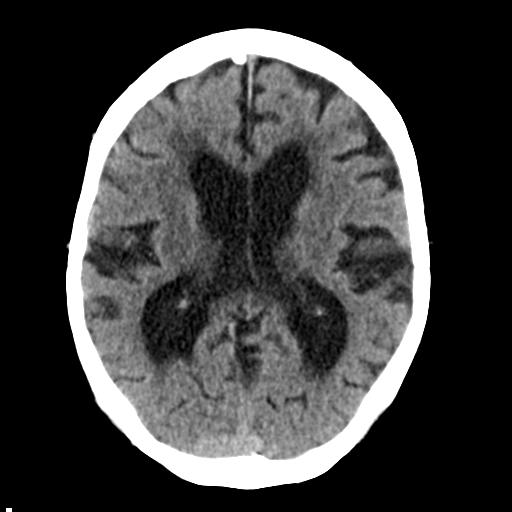
[im 17/30  brain]
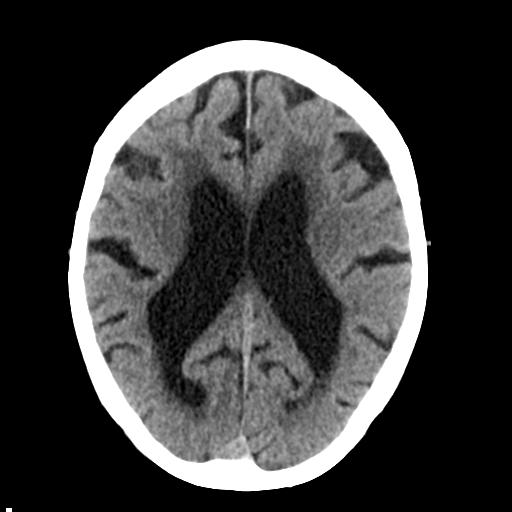
[im 19/30  brain]
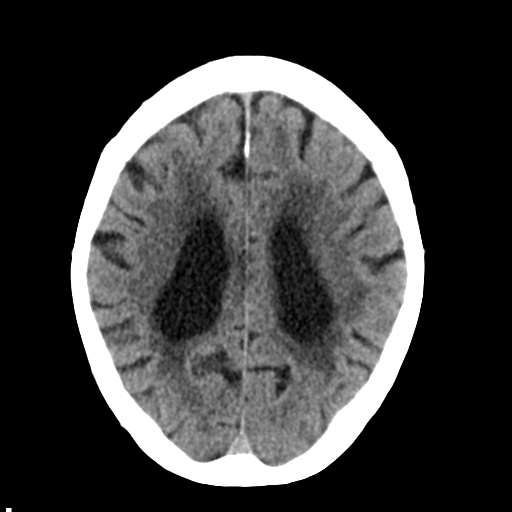
[im 19/30  bone]
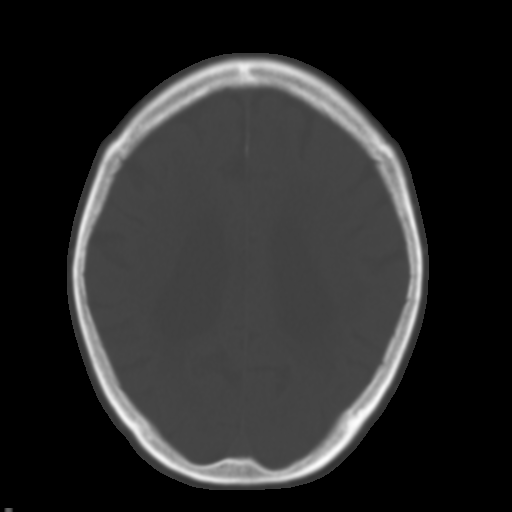
[im 21/30  brain]
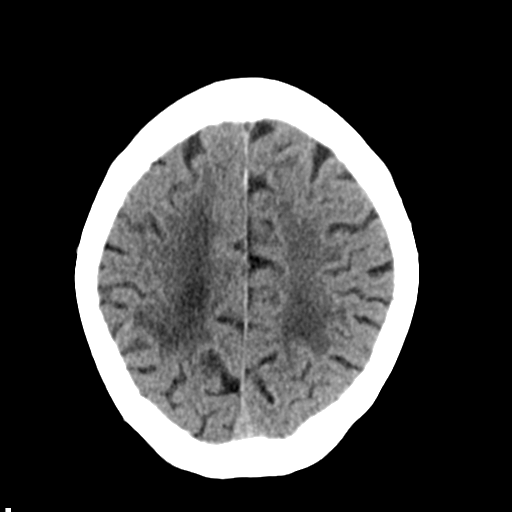
[im 23/30  brain]
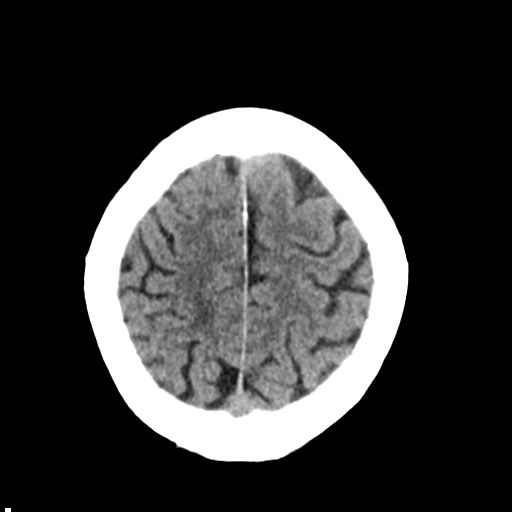
[im 25/30  brain]
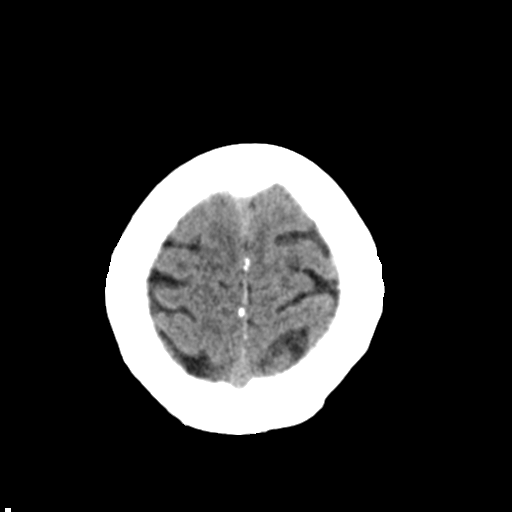
[im 27/30  brain]
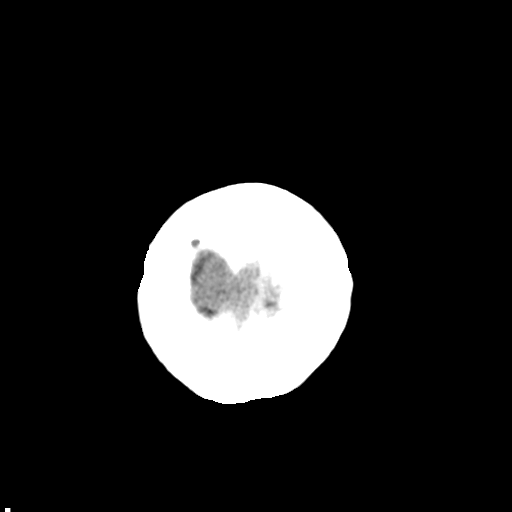
[im 27/30  bone]
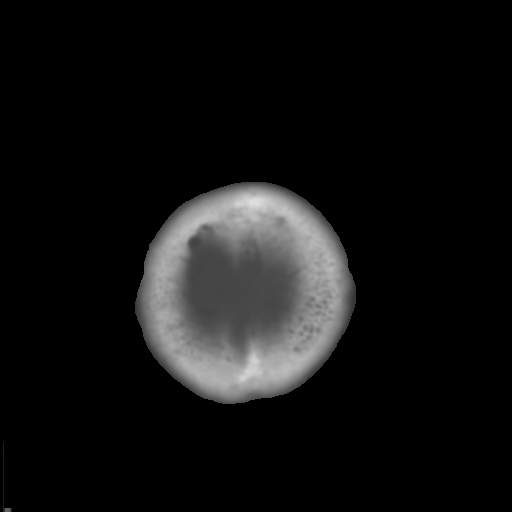

[13 of 30 positions shown; findings below may reference images not displayed]

FINDINGS: Brain: Moderate atrophy. Ventricular enlargement consistent with
atrophy. Diffuse white matter hypodensity is symmetric bilaterally
and most likely chronic.

Negative for acute infarct, hemorrhage, mass. Normal enhancement
postcontrast infusion.

Vascular: Negative for hyperdense vessel. Atherosclerotic
calcification cavernous carotid bilaterally. Normal vascular
enhancement.

Skull: Negative

Sinuses/Orbits: Negative

Other: None
IMPRESSION: No acute intracranial abnormality

Moderate atrophy without hydrocephalus. Moderate white matter
changes most likely chronic microvascular ischemia.

## 2020-01-12 DIAGNOSIS — E538 Deficiency of other specified B group vitamins: Secondary | ICD-10-CM | POA: Diagnosis not present

## 2020-01-24 IMAGING — XA DG C-ARM 1-60 MIN
1 series · 1 of 1 positions shown · non-contrast
Comparison: 07/12/2011

CLINICAL DATA: L3, L4 kyphoplasty

EXAM:
LUMBAR SPINE - 2-3 VIEW; DG C-ARM 1-60 MIN

[Series 13: cont. · 1 of 1 slices shown]
[im 1/1]
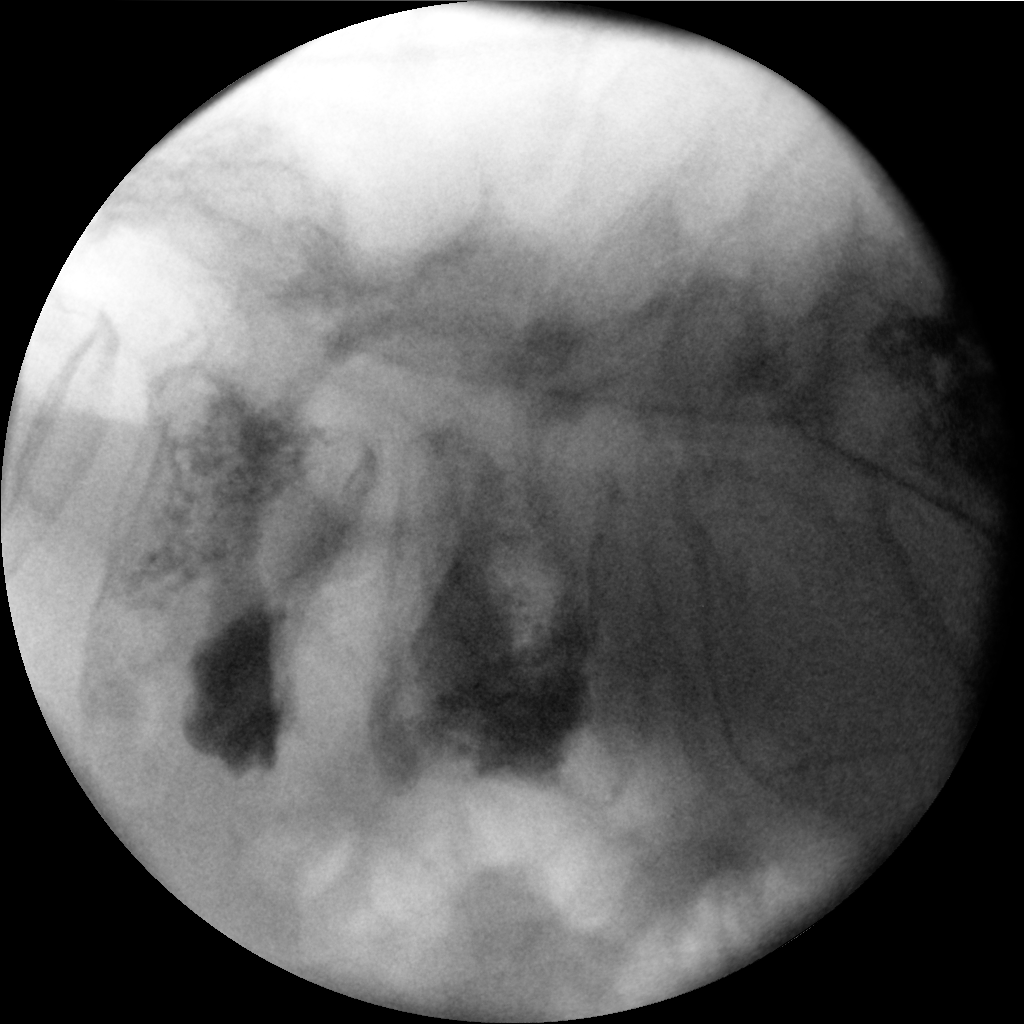

[1 of 1 positions shown; findings below may reference images not displayed]

FLUOROSCOPY TIME:  Radiation Exposure Index (as provided by the
fluoroscopic device): 31.9 mGy

If the device does not provide the exposure index:

Fluoroscopy Time:  1 minutes 42 seconds

Number of Acquired Images:  3
FINDINGS: Contrast laden cement is noted in the L3 and L4 vertebral bodies.
IMPRESSION: L3/L4 kyphoplasty

## 2020-02-14 DIAGNOSIS — E538 Deficiency of other specified B group vitamins: Secondary | ICD-10-CM | POA: Diagnosis not present

## 2020-03-13 DIAGNOSIS — R000 Tachycardia, unspecified: Secondary | ICD-10-CM | POA: Diagnosis not present

## 2020-03-13 DIAGNOSIS — R600 Localized edema: Secondary | ICD-10-CM | POA: Diagnosis not present

## 2020-03-15 DIAGNOSIS — E538 Deficiency of other specified B group vitamins: Secondary | ICD-10-CM | POA: Diagnosis not present

## 2020-03-20 DIAGNOSIS — I10 Essential (primary) hypertension: Secondary | ICD-10-CM | POA: Diagnosis not present

## 2020-03-20 DIAGNOSIS — E871 Hypo-osmolality and hyponatremia: Secondary | ICD-10-CM | POA: Diagnosis not present

## 2020-03-20 DIAGNOSIS — R600 Localized edema: Secondary | ICD-10-CM | POA: Diagnosis not present

## 2020-03-20 DIAGNOSIS — L539 Erythematous condition, unspecified: Secondary | ICD-10-CM | POA: Diagnosis not present

## 2020-04-17 DIAGNOSIS — E538 Deficiency of other specified B group vitamins: Secondary | ICD-10-CM | POA: Diagnosis not present

## 2020-04-17 DIAGNOSIS — J449 Chronic obstructive pulmonary disease, unspecified: Secondary | ICD-10-CM | POA: Diagnosis not present

## 2020-04-21 DIAGNOSIS — I482 Chronic atrial fibrillation, unspecified: Secondary | ICD-10-CM | POA: Diagnosis not present

## 2020-04-21 DIAGNOSIS — M5442 Lumbago with sciatica, left side: Secondary | ICD-10-CM | POA: Diagnosis not present

## 2020-04-21 DIAGNOSIS — G8929 Other chronic pain: Secondary | ICD-10-CM | POA: Diagnosis not present

## 2020-04-21 DIAGNOSIS — F419 Anxiety disorder, unspecified: Secondary | ICD-10-CM | POA: Diagnosis not present

## 2020-04-21 DIAGNOSIS — F0150 Vascular dementia without behavioral disturbance: Secondary | ICD-10-CM | POA: Diagnosis not present

## 2020-04-21 DIAGNOSIS — I10 Essential (primary) hypertension: Secondary | ICD-10-CM | POA: Diagnosis not present

## 2020-05-17 DIAGNOSIS — E538 Deficiency of other specified B group vitamins: Secondary | ICD-10-CM | POA: Diagnosis not present

## 2020-06-19 DIAGNOSIS — E538 Deficiency of other specified B group vitamins: Secondary | ICD-10-CM | POA: Diagnosis not present

## 2020-06-23 DIAGNOSIS — L03119 Cellulitis of unspecified part of limb: Secondary | ICD-10-CM | POA: Diagnosis not present

## 2020-06-23 DIAGNOSIS — L232 Allergic contact dermatitis due to cosmetics: Secondary | ICD-10-CM | POA: Diagnosis not present

## 2020-06-23 DIAGNOSIS — R600 Localized edema: Secondary | ICD-10-CM | POA: Diagnosis not present

## 2020-07-04 DIAGNOSIS — R7309 Other abnormal glucose: Secondary | ICD-10-CM | POA: Diagnosis not present

## 2020-07-04 DIAGNOSIS — E538 Deficiency of other specified B group vitamins: Secondary | ICD-10-CM | POA: Diagnosis not present

## 2020-07-04 DIAGNOSIS — E7800 Pure hypercholesterolemia, unspecified: Secondary | ICD-10-CM | POA: Diagnosis not present

## 2020-07-04 DIAGNOSIS — R600 Localized edema: Secondary | ICD-10-CM | POA: Diagnosis not present

## 2020-07-04 DIAGNOSIS — Z131 Encounter for screening for diabetes mellitus: Secondary | ICD-10-CM | POA: Diagnosis not present

## 2020-07-04 DIAGNOSIS — E871 Hypo-osmolality and hyponatremia: Secondary | ICD-10-CM | POA: Diagnosis not present

## 2020-07-04 DIAGNOSIS — I10 Essential (primary) hypertension: Secondary | ICD-10-CM | POA: Diagnosis not present

## 2020-07-04 DIAGNOSIS — Z0000 Encounter for general adult medical examination without abnormal findings: Secondary | ICD-10-CM | POA: Diagnosis not present

## 2020-07-04 DIAGNOSIS — D510 Vitamin B12 deficiency anemia due to intrinsic factor deficiency: Secondary | ICD-10-CM | POA: Diagnosis not present

## 2020-07-20 DIAGNOSIS — E538 Deficiency of other specified B group vitamins: Secondary | ICD-10-CM | POA: Diagnosis not present

## 2020-08-21 DIAGNOSIS — Z23 Encounter for immunization: Secondary | ICD-10-CM | POA: Diagnosis not present

## 2020-08-21 DIAGNOSIS — E538 Deficiency of other specified B group vitamins: Secondary | ICD-10-CM | POA: Diagnosis not present

## 2020-08-22 DIAGNOSIS — Z23 Encounter for immunization: Secondary | ICD-10-CM | POA: Diagnosis not present

## 2020-08-22 DIAGNOSIS — E538 Deficiency of other specified B group vitamins: Secondary | ICD-10-CM | POA: Diagnosis not present

## 2020-09-21 DIAGNOSIS — E538 Deficiency of other specified B group vitamins: Secondary | ICD-10-CM | POA: Diagnosis not present

## 2020-10-13 DIAGNOSIS — R600 Localized edema: Secondary | ICD-10-CM | POA: Diagnosis not present

## 2020-10-13 DIAGNOSIS — E7800 Pure hypercholesterolemia, unspecified: Secondary | ICD-10-CM | POA: Diagnosis not present

## 2020-10-13 DIAGNOSIS — I482 Chronic atrial fibrillation, unspecified: Secondary | ICD-10-CM | POA: Diagnosis not present

## 2020-10-13 DIAGNOSIS — F419 Anxiety disorder, unspecified: Secondary | ICD-10-CM | POA: Diagnosis not present

## 2020-10-13 DIAGNOSIS — F0150 Vascular dementia without behavioral disturbance: Secondary | ICD-10-CM | POA: Diagnosis not present

## 2020-10-13 DIAGNOSIS — I10 Essential (primary) hypertension: Secondary | ICD-10-CM | POA: Diagnosis not present

## 2020-10-23 DIAGNOSIS — E538 Deficiency of other specified B group vitamins: Secondary | ICD-10-CM | POA: Diagnosis not present

## 2020-11-23 DIAGNOSIS — E538 Deficiency of other specified B group vitamins: Secondary | ICD-10-CM | POA: Diagnosis not present

## 2020-12-25 DIAGNOSIS — E538 Deficiency of other specified B group vitamins: Secondary | ICD-10-CM | POA: Diagnosis not present

## 2021-01-24 DIAGNOSIS — E538 Deficiency of other specified B group vitamins: Secondary | ICD-10-CM | POA: Diagnosis not present

## 2021-02-21 DIAGNOSIS — J449 Chronic obstructive pulmonary disease, unspecified: Secondary | ICD-10-CM | POA: Diagnosis not present

## 2021-02-21 DIAGNOSIS — F0150 Vascular dementia without behavioral disturbance: Secondary | ICD-10-CM | POA: Diagnosis not present

## 2021-02-21 DIAGNOSIS — R600 Localized edema: Secondary | ICD-10-CM | POA: Diagnosis not present

## 2021-02-21 DIAGNOSIS — G40909 Epilepsy, unspecified, not intractable, without status epilepticus: Secondary | ICD-10-CM | POA: Diagnosis not present

## 2021-02-21 DIAGNOSIS — I482 Chronic atrial fibrillation, unspecified: Secondary | ICD-10-CM | POA: Diagnosis not present

## 2021-02-21 DIAGNOSIS — I10 Essential (primary) hypertension: Secondary | ICD-10-CM | POA: Diagnosis not present

## 2021-02-27 DIAGNOSIS — E538 Deficiency of other specified B group vitamins: Secondary | ICD-10-CM | POA: Diagnosis not present

## 2021-03-30 DIAGNOSIS — E538 Deficiency of other specified B group vitamins: Secondary | ICD-10-CM | POA: Diagnosis not present

## 2021-04-27 DIAGNOSIS — I482 Chronic atrial fibrillation, unspecified: Secondary | ICD-10-CM | POA: Diagnosis not present

## 2021-04-27 DIAGNOSIS — F0150 Vascular dementia without behavioral disturbance: Secondary | ICD-10-CM | POA: Diagnosis not present

## 2021-04-27 DIAGNOSIS — I10 Essential (primary) hypertension: Secondary | ICD-10-CM | POA: Diagnosis not present

## 2021-04-27 DIAGNOSIS — F419 Anxiety disorder, unspecified: Secondary | ICD-10-CM | POA: Diagnosis not present

## 2021-04-27 DIAGNOSIS — E7800 Pure hypercholesterolemia, unspecified: Secondary | ICD-10-CM | POA: Diagnosis not present

## 2021-04-30 DIAGNOSIS — E538 Deficiency of other specified B group vitamins: Secondary | ICD-10-CM | POA: Diagnosis not present

## 2021-05-30 DIAGNOSIS — E538 Deficiency of other specified B group vitamins: Secondary | ICD-10-CM | POA: Diagnosis not present

## 2021-07-02 DIAGNOSIS — E538 Deficiency of other specified B group vitamins: Secondary | ICD-10-CM | POA: Diagnosis not present

## 2021-08-02 DIAGNOSIS — E538 Deficiency of other specified B group vitamins: Secondary | ICD-10-CM | POA: Diagnosis not present

## 2021-08-28 DIAGNOSIS — F0150 Vascular dementia without behavioral disturbance: Secondary | ICD-10-CM | POA: Diagnosis not present

## 2021-08-28 DIAGNOSIS — F419 Anxiety disorder, unspecified: Secondary | ICD-10-CM | POA: Diagnosis not present

## 2021-08-28 DIAGNOSIS — I10 Essential (primary) hypertension: Secondary | ICD-10-CM | POA: Diagnosis not present

## 2021-08-28 DIAGNOSIS — Z0000 Encounter for general adult medical examination without abnormal findings: Secondary | ICD-10-CM | POA: Diagnosis not present

## 2021-08-28 DIAGNOSIS — R531 Weakness: Secondary | ICD-10-CM | POA: Diagnosis not present

## 2021-08-28 DIAGNOSIS — E871 Hypo-osmolality and hyponatremia: Secondary | ICD-10-CM | POA: Diagnosis not present

## 2021-08-28 DIAGNOSIS — R7309 Other abnormal glucose: Secondary | ICD-10-CM | POA: Diagnosis not present

## 2021-08-28 DIAGNOSIS — D510 Vitamin B12 deficiency anemia due to intrinsic factor deficiency: Secondary | ICD-10-CM | POA: Diagnosis not present

## 2021-08-28 DIAGNOSIS — Z23 Encounter for immunization: Secondary | ICD-10-CM | POA: Diagnosis not present

## 2021-08-28 DIAGNOSIS — I482 Chronic atrial fibrillation, unspecified: Secondary | ICD-10-CM | POA: Diagnosis not present

## 2021-08-28 DIAGNOSIS — E7800 Pure hypercholesterolemia, unspecified: Secondary | ICD-10-CM | POA: Diagnosis not present

## 2021-08-28 DIAGNOSIS — E538 Deficiency of other specified B group vitamins: Secondary | ICD-10-CM | POA: Diagnosis not present

## 2021-09-03 DIAGNOSIS — E538 Deficiency of other specified B group vitamins: Secondary | ICD-10-CM | POA: Diagnosis not present

## 2021-10-08 DIAGNOSIS — E538 Deficiency of other specified B group vitamins: Secondary | ICD-10-CM | POA: Diagnosis not present

## 2021-10-12 DIAGNOSIS — F419 Anxiety disorder, unspecified: Secondary | ICD-10-CM | POA: Diagnosis not present

## 2021-10-12 DIAGNOSIS — I482 Chronic atrial fibrillation, unspecified: Secondary | ICD-10-CM | POA: Diagnosis not present

## 2021-10-12 DIAGNOSIS — I10 Essential (primary) hypertension: Secondary | ICD-10-CM | POA: Diagnosis not present

## 2021-10-12 DIAGNOSIS — E7800 Pure hypercholesterolemia, unspecified: Secondary | ICD-10-CM | POA: Diagnosis not present

## 2021-10-12 DIAGNOSIS — E871 Hypo-osmolality and hyponatremia: Secondary | ICD-10-CM | POA: Diagnosis not present

## 2021-10-12 DIAGNOSIS — F0150 Vascular dementia without behavioral disturbance: Secondary | ICD-10-CM | POA: Diagnosis not present

## 2021-10-12 DIAGNOSIS — R531 Weakness: Secondary | ICD-10-CM | POA: Diagnosis not present

## 2021-11-07 DIAGNOSIS — E538 Deficiency of other specified B group vitamins: Secondary | ICD-10-CM | POA: Diagnosis not present

## 2021-12-10 DIAGNOSIS — E538 Deficiency of other specified B group vitamins: Secondary | ICD-10-CM | POA: Diagnosis not present

## 2022-01-09 DIAGNOSIS — E538 Deficiency of other specified B group vitamins: Secondary | ICD-10-CM | POA: Diagnosis not present

## 2022-02-11 DIAGNOSIS — E538 Deficiency of other specified B group vitamins: Secondary | ICD-10-CM | POA: Diagnosis not present

## 2022-03-14 DIAGNOSIS — E538 Deficiency of other specified B group vitamins: Secondary | ICD-10-CM | POA: Diagnosis not present

## 2022-04-15 DIAGNOSIS — E538 Deficiency of other specified B group vitamins: Secondary | ICD-10-CM | POA: Diagnosis not present

## 2022-05-10 DIAGNOSIS — I482 Chronic atrial fibrillation, unspecified: Secondary | ICD-10-CM | POA: Diagnosis not present

## 2022-05-10 DIAGNOSIS — R531 Weakness: Secondary | ICD-10-CM | POA: Diagnosis not present

## 2022-05-10 DIAGNOSIS — I10 Essential (primary) hypertension: Secondary | ICD-10-CM | POA: Diagnosis not present

## 2022-05-10 DIAGNOSIS — E7800 Pure hypercholesterolemia, unspecified: Secondary | ICD-10-CM | POA: Diagnosis not present

## 2022-05-10 DIAGNOSIS — Z09 Encounter for follow-up examination after completed treatment for conditions other than malignant neoplasm: Secondary | ICD-10-CM | POA: Diagnosis not present

## 2022-05-10 DIAGNOSIS — E871 Hypo-osmolality and hyponatremia: Secondary | ICD-10-CM | POA: Diagnosis not present

## 2022-05-10 DIAGNOSIS — D510 Vitamin B12 deficiency anemia due to intrinsic factor deficiency: Secondary | ICD-10-CM | POA: Diagnosis not present

## 2022-05-15 DIAGNOSIS — E538 Deficiency of other specified B group vitamins: Secondary | ICD-10-CM | POA: Diagnosis not present

## 2022-06-13 DIAGNOSIS — I10 Essential (primary) hypertension: Secondary | ICD-10-CM | POA: Diagnosis not present

## 2022-06-13 DIAGNOSIS — I482 Chronic atrial fibrillation, unspecified: Secondary | ICD-10-CM | POA: Diagnosis not present

## 2022-06-13 DIAGNOSIS — E7800 Pure hypercholesterolemia, unspecified: Secondary | ICD-10-CM | POA: Diagnosis not present

## 2022-06-13 DIAGNOSIS — F419 Anxiety disorder, unspecified: Secondary | ICD-10-CM | POA: Diagnosis not present

## 2022-06-17 DIAGNOSIS — E538 Deficiency of other specified B group vitamins: Secondary | ICD-10-CM | POA: Diagnosis not present

## 2022-07-18 DIAGNOSIS — E538 Deficiency of other specified B group vitamins: Secondary | ICD-10-CM | POA: Diagnosis not present

## 2022-08-19 DIAGNOSIS — E538 Deficiency of other specified B group vitamins: Secondary | ICD-10-CM | POA: Diagnosis not present

## 2022-08-19 DIAGNOSIS — Z23 Encounter for immunization: Secondary | ICD-10-CM | POA: Diagnosis not present

## 2022-09-19 DIAGNOSIS — E538 Deficiency of other specified B group vitamins: Secondary | ICD-10-CM | POA: Diagnosis not present

## 2022-10-21 DIAGNOSIS — E538 Deficiency of other specified B group vitamins: Secondary | ICD-10-CM | POA: Diagnosis not present

## 2022-11-15 DIAGNOSIS — E7800 Pure hypercholesterolemia, unspecified: Secondary | ICD-10-CM | POA: Diagnosis not present

## 2022-11-15 DIAGNOSIS — I482 Chronic atrial fibrillation, unspecified: Secondary | ICD-10-CM | POA: Diagnosis not present

## 2022-11-15 DIAGNOSIS — D510 Vitamin B12 deficiency anemia due to intrinsic factor deficiency: Secondary | ICD-10-CM | POA: Diagnosis not present

## 2022-11-15 DIAGNOSIS — F419 Anxiety disorder, unspecified: Secondary | ICD-10-CM | POA: Diagnosis not present

## 2022-11-15 DIAGNOSIS — Z0000 Encounter for general adult medical examination without abnormal findings: Secondary | ICD-10-CM | POA: Diagnosis not present

## 2022-11-15 DIAGNOSIS — G40909 Epilepsy, unspecified, not intractable, without status epilepticus: Secondary | ICD-10-CM | POA: Diagnosis not present

## 2022-11-15 DIAGNOSIS — Z853 Personal history of malignant neoplasm of breast: Secondary | ICD-10-CM | POA: Diagnosis not present

## 2022-11-15 DIAGNOSIS — F0150 Vascular dementia without behavioral disturbance: Secondary | ICD-10-CM | POA: Diagnosis not present

## 2022-11-15 DIAGNOSIS — R413 Other amnesia: Secondary | ICD-10-CM | POA: Diagnosis not present

## 2022-11-15 DIAGNOSIS — E538 Deficiency of other specified B group vitamins: Secondary | ICD-10-CM | POA: Diagnosis not present

## 2022-11-15 DIAGNOSIS — I10 Essential (primary) hypertension: Secondary | ICD-10-CM | POA: Diagnosis not present

## 2022-11-15 DIAGNOSIS — R531 Weakness: Secondary | ICD-10-CM | POA: Diagnosis not present

## 2022-11-15 DIAGNOSIS — E871 Hypo-osmolality and hyponatremia: Secondary | ICD-10-CM | POA: Diagnosis not present

## 2022-11-15 DIAGNOSIS — Z1331 Encounter for screening for depression: Secondary | ICD-10-CM | POA: Diagnosis not present

## 2022-11-15 DIAGNOSIS — I119 Hypertensive heart disease without heart failure: Secondary | ICD-10-CM | POA: Diagnosis not present

## 2022-11-15 DIAGNOSIS — R7303 Prediabetes: Secondary | ICD-10-CM | POA: Diagnosis not present

## 2022-11-21 DIAGNOSIS — E538 Deficiency of other specified B group vitamins: Secondary | ICD-10-CM | POA: Diagnosis not present

## 2022-12-24 DIAGNOSIS — E538 Deficiency of other specified B group vitamins: Secondary | ICD-10-CM | POA: Diagnosis not present

## 2023-01-22 DIAGNOSIS — E538 Deficiency of other specified B group vitamins: Secondary | ICD-10-CM | POA: Diagnosis not present

## 2023-02-11 DIAGNOSIS — L03115 Cellulitis of right lower limb: Secondary | ICD-10-CM | POA: Diagnosis not present

## 2023-02-11 DIAGNOSIS — R600 Localized edema: Secondary | ICD-10-CM | POA: Diagnosis not present

## 2023-02-24 ENCOUNTER — Ambulatory Visit: Admission: RE | Admit: 2023-02-24 | Payer: Medicare HMO | Source: Ambulatory Visit

## 2023-02-24 ENCOUNTER — Other Ambulatory Visit: Payer: Self-pay | Admitting: Gerontology

## 2023-02-24 DIAGNOSIS — R600 Localized edema: Secondary | ICD-10-CM

## 2023-02-24 DIAGNOSIS — E538 Deficiency of other specified B group vitamins: Secondary | ICD-10-CM | POA: Diagnosis not present

## 2023-02-24 DIAGNOSIS — M7989 Other specified soft tissue disorders: Secondary | ICD-10-CM | POA: Diagnosis not present

## 2023-02-24 DIAGNOSIS — M79604 Pain in right leg: Secondary | ICD-10-CM | POA: Diagnosis not present

## 2023-02-26 ENCOUNTER — Ambulatory Visit
Admission: RE | Admit: 2023-02-26 | Discharge: 2023-02-26 | Disposition: A | Discharge status: Home / Self Care | Payer: Medicare HMO | Source: Ambulatory Visit | Attending: Gerontology | Admitting: Gerontology

## 2023-02-26 DIAGNOSIS — M7989 Other specified soft tissue disorders: Secondary | ICD-10-CM | POA: Diagnosis not present

## 2023-02-26 DIAGNOSIS — R600 Localized edema: Secondary | ICD-10-CM | POA: Diagnosis not present

## 2023-02-26 DIAGNOSIS — M79604 Pain in right leg: Secondary | ICD-10-CM | POA: Diagnosis not present

## 2023-03-06 DIAGNOSIS — I482 Chronic atrial fibrillation, unspecified: Secondary | ICD-10-CM | POA: Diagnosis not present

## 2023-03-06 DIAGNOSIS — F0150 Vascular dementia without behavioral disturbance: Secondary | ICD-10-CM | POA: Diagnosis not present

## 2023-03-06 DIAGNOSIS — R7303 Prediabetes: Secondary | ICD-10-CM | POA: Diagnosis not present

## 2023-03-06 DIAGNOSIS — I10 Essential (primary) hypertension: Secondary | ICD-10-CM | POA: Diagnosis not present

## 2023-03-06 DIAGNOSIS — F419 Anxiety disorder, unspecified: Secondary | ICD-10-CM | POA: Diagnosis not present

## 2023-03-06 DIAGNOSIS — E7800 Pure hypercholesterolemia, unspecified: Secondary | ICD-10-CM | POA: Diagnosis not present

## 2023-03-06 DIAGNOSIS — E871 Hypo-osmolality and hyponatremia: Secondary | ICD-10-CM | POA: Diagnosis not present

## 2023-03-26 DIAGNOSIS — E538 Deficiency of other specified B group vitamins: Secondary | ICD-10-CM | POA: Diagnosis not present

## 2023-04-29 DIAGNOSIS — E538 Deficiency of other specified B group vitamins: Secondary | ICD-10-CM | POA: Diagnosis not present

## 2023-05-27 DIAGNOSIS — E538 Deficiency of other specified B group vitamins: Secondary | ICD-10-CM | POA: Diagnosis not present

## 2023-05-27 DIAGNOSIS — F419 Anxiety disorder, unspecified: Secondary | ICD-10-CM | POA: Diagnosis not present

## 2023-05-27 DIAGNOSIS — R600 Localized edema: Secondary | ICD-10-CM | POA: Diagnosis not present

## 2023-05-27 DIAGNOSIS — Z09 Encounter for follow-up examination after completed treatment for conditions other than malignant neoplasm: Secondary | ICD-10-CM | POA: Diagnosis not present

## 2023-05-27 DIAGNOSIS — E871 Hypo-osmolality and hyponatremia: Secondary | ICD-10-CM | POA: Diagnosis not present

## 2023-05-27 DIAGNOSIS — E7800 Pure hypercholesterolemia, unspecified: Secondary | ICD-10-CM | POA: Diagnosis not present

## 2023-05-27 DIAGNOSIS — I482 Chronic atrial fibrillation, unspecified: Secondary | ICD-10-CM | POA: Diagnosis not present

## 2023-05-27 DIAGNOSIS — F0150 Vascular dementia without behavioral disturbance: Secondary | ICD-10-CM | POA: Diagnosis not present

## 2023-05-27 DIAGNOSIS — I119 Hypertensive heart disease without heart failure: Secondary | ICD-10-CM | POA: Diagnosis not present

## 2023-05-29 DIAGNOSIS — E538 Deficiency of other specified B group vitamins: Secondary | ICD-10-CM | POA: Diagnosis not present

## 2023-06-30 DIAGNOSIS — E538 Deficiency of other specified B group vitamins: Secondary | ICD-10-CM | POA: Diagnosis not present

## 2023-07-03 DIAGNOSIS — R531 Weakness: Secondary | ICD-10-CM | POA: Diagnosis not present

## 2023-07-03 DIAGNOSIS — F0150 Vascular dementia without behavioral disturbance: Secondary | ICD-10-CM | POA: Diagnosis not present

## 2023-07-03 DIAGNOSIS — F419 Anxiety disorder, unspecified: Secondary | ICD-10-CM | POA: Diagnosis not present

## 2023-07-03 DIAGNOSIS — I10 Essential (primary) hypertension: Secondary | ICD-10-CM | POA: Diagnosis not present

## 2023-07-03 DIAGNOSIS — R7303 Prediabetes: Secondary | ICD-10-CM | POA: Diagnosis not present

## 2023-07-03 DIAGNOSIS — I482 Chronic atrial fibrillation, unspecified: Secondary | ICD-10-CM | POA: Diagnosis not present

## 2023-07-03 DIAGNOSIS — G40909 Epilepsy, unspecified, not intractable, without status epilepticus: Secondary | ICD-10-CM | POA: Diagnosis not present

## 2023-07-03 DIAGNOSIS — R600 Localized edema: Secondary | ICD-10-CM | POA: Diagnosis not present

## 2023-07-03 DIAGNOSIS — E7800 Pure hypercholesterolemia, unspecified: Secondary | ICD-10-CM | POA: Diagnosis not present

## 2023-07-31 DIAGNOSIS — E538 Deficiency of other specified B group vitamins: Secondary | ICD-10-CM | POA: Diagnosis not present

## 2023-09-01 DIAGNOSIS — E538 Deficiency of other specified B group vitamins: Secondary | ICD-10-CM | POA: Diagnosis not present

## 2023-09-12 DIAGNOSIS — L03116 Cellulitis of left lower limb: Secondary | ICD-10-CM | POA: Diagnosis not present

## 2023-09-12 DIAGNOSIS — L03115 Cellulitis of right lower limb: Secondary | ICD-10-CM | POA: Diagnosis not present

## 2023-10-02 DIAGNOSIS — E538 Deficiency of other specified B group vitamins: Secondary | ICD-10-CM | POA: Diagnosis not present

## 2023-11-03 DIAGNOSIS — E538 Deficiency of other specified B group vitamins: Secondary | ICD-10-CM | POA: Diagnosis not present

## 2023-12-04 DIAGNOSIS — R7303 Prediabetes: Secondary | ICD-10-CM | POA: Diagnosis not present

## 2023-12-04 DIAGNOSIS — E7800 Pure hypercholesterolemia, unspecified: Secondary | ICD-10-CM | POA: Diagnosis not present

## 2023-12-04 DIAGNOSIS — G40909 Epilepsy, unspecified, not intractable, without status epilepticus: Secondary | ICD-10-CM | POA: Diagnosis not present

## 2023-12-04 DIAGNOSIS — R413 Other amnesia: Secondary | ICD-10-CM | POA: Diagnosis not present

## 2023-12-04 DIAGNOSIS — I482 Chronic atrial fibrillation, unspecified: Secondary | ICD-10-CM | POA: Diagnosis not present

## 2023-12-04 DIAGNOSIS — F419 Anxiety disorder, unspecified: Secondary | ICD-10-CM | POA: Diagnosis not present

## 2023-12-04 DIAGNOSIS — E538 Deficiency of other specified B group vitamins: Secondary | ICD-10-CM | POA: Diagnosis not present

## 2023-12-04 DIAGNOSIS — I10 Essential (primary) hypertension: Secondary | ICD-10-CM | POA: Diagnosis not present

## 2023-12-04 DIAGNOSIS — M899 Disorder of bone, unspecified: Secondary | ICD-10-CM | POA: Diagnosis not present

## 2023-12-04 DIAGNOSIS — R531 Weakness: Secondary | ICD-10-CM | POA: Diagnosis not present

## 2023-12-04 DIAGNOSIS — Z1331 Encounter for screening for depression: Secondary | ICD-10-CM | POA: Diagnosis not present

## 2023-12-04 DIAGNOSIS — F0150 Vascular dementia without behavioral disturbance: Secondary | ICD-10-CM | POA: Diagnosis not present

## 2023-12-04 DIAGNOSIS — Z853 Personal history of malignant neoplasm of breast: Secondary | ICD-10-CM | POA: Diagnosis not present

## 2023-12-04 DIAGNOSIS — Z0000 Encounter for general adult medical examination without abnormal findings: Secondary | ICD-10-CM | POA: Diagnosis not present

## 2023-12-04 DIAGNOSIS — D510 Vitamin B12 deficiency anemia due to intrinsic factor deficiency: Secondary | ICD-10-CM | POA: Diagnosis not present

## 2024-01-05 DIAGNOSIS — E538 Deficiency of other specified B group vitamins: Secondary | ICD-10-CM | POA: Diagnosis not present

## 2024-01-28 DIAGNOSIS — G8929 Other chronic pain: Secondary | ICD-10-CM | POA: Diagnosis not present

## 2024-01-28 DIAGNOSIS — G40909 Epilepsy, unspecified, not intractable, without status epilepticus: Secondary | ICD-10-CM | POA: Diagnosis not present

## 2024-01-28 DIAGNOSIS — M545 Low back pain, unspecified: Secondary | ICD-10-CM | POA: Diagnosis not present

## 2024-01-28 DIAGNOSIS — R5383 Other fatigue: Secondary | ICD-10-CM | POA: Diagnosis not present

## 2024-02-02 DIAGNOSIS — E538 Deficiency of other specified B group vitamins: Secondary | ICD-10-CM | POA: Diagnosis not present

## 2024-02-18 DIAGNOSIS — E860 Dehydration: Secondary | ICD-10-CM | POA: Diagnosis not present

## 2024-02-18 DIAGNOSIS — I10 Essential (primary) hypertension: Secondary | ICD-10-CM | POA: Diagnosis not present

## 2024-02-18 DIAGNOSIS — J0100 Acute maxillary sinusitis, unspecified: Secondary | ICD-10-CM | POA: Diagnosis not present

## 2024-02-19 DIAGNOSIS — I10 Essential (primary) hypertension: Secondary | ICD-10-CM | POA: Diagnosis not present

## 2024-02-19 DIAGNOSIS — E860 Dehydration: Secondary | ICD-10-CM | POA: Diagnosis not present

## 2024-03-04 DIAGNOSIS — E538 Deficiency of other specified B group vitamins: Secondary | ICD-10-CM | POA: Diagnosis not present

## 2024-03-16 DIAGNOSIS — H6122 Impacted cerumen, left ear: Secondary | ICD-10-CM | POA: Diagnosis not present

## 2024-03-16 DIAGNOSIS — B370 Candidal stomatitis: Secondary | ICD-10-CM | POA: Diagnosis not present

## 2024-03-16 DIAGNOSIS — R0981 Nasal congestion: Secondary | ICD-10-CM | POA: Diagnosis not present

## 2024-03-16 DIAGNOSIS — R630 Anorexia: Secondary | ICD-10-CM | POA: Diagnosis not present

## 2024-03-22 DIAGNOSIS — R42 Dizziness and giddiness: Secondary | ICD-10-CM | POA: Diagnosis not present

## 2024-03-22 DIAGNOSIS — I482 Chronic atrial fibrillation, unspecified: Secondary | ICD-10-CM | POA: Diagnosis not present

## 2024-03-22 DIAGNOSIS — I10 Essential (primary) hypertension: Secondary | ICD-10-CM | POA: Diagnosis not present

## 2024-03-25 ENCOUNTER — Emergency Department

## 2024-03-25 ENCOUNTER — Emergency Department
Admission: EM | Admit: 2024-03-25 | Discharge: 2024-03-25 | Disposition: A | Discharge status: Home / Self Care | Attending: Emergency Medicine | Admitting: Emergency Medicine

## 2024-03-25 DIAGNOSIS — R531 Weakness: Secondary | ICD-10-CM | POA: Diagnosis not present

## 2024-03-25 DIAGNOSIS — R0989 Other specified symptoms and signs involving the circulatory and respiratory systems: Secondary | ICD-10-CM | POA: Diagnosis not present

## 2024-03-25 DIAGNOSIS — I10 Essential (primary) hypertension: Secondary | ICD-10-CM | POA: Diagnosis not present

## 2024-03-25 DIAGNOSIS — R140 Abdominal distension (gaseous): Secondary | ICD-10-CM | POA: Diagnosis not present

## 2024-03-25 DIAGNOSIS — R002 Palpitations: Secondary | ICD-10-CM | POA: Diagnosis not present

## 2024-03-25 DIAGNOSIS — I480 Paroxysmal atrial fibrillation: Secondary | ICD-10-CM | POA: Diagnosis not present

## 2024-03-25 DIAGNOSIS — J90 Pleural effusion, not elsewhere classified: Secondary | ICD-10-CM | POA: Diagnosis not present

## 2024-03-25 DIAGNOSIS — R0689 Other abnormalities of breathing: Secondary | ICD-10-CM | POA: Diagnosis not present

## 2024-03-25 DIAGNOSIS — I4891 Unspecified atrial fibrillation: Secondary | ICD-10-CM | POA: Diagnosis not present

## 2024-03-25 DIAGNOSIS — R000 Tachycardia, unspecified: Secondary | ICD-10-CM | POA: Diagnosis not present

## 2024-03-25 LAB — COMPREHENSIVE METABOLIC PANEL WITH GFR
ALT: 18 U/L (ref 0–44)
AST: 35 U/L (ref 15–41)
Albumin: 3.7 g/dL (ref 3.5–5.0)
Alkaline Phosphatase: 58 U/L (ref 38–126)
Anion gap: 11 (ref 5–15)
BUN: 25 mg/dL — ABNORMAL HIGH (ref 8–23)
CO2: 35 mmol/L — ABNORMAL HIGH (ref 22–32)
Calcium: 10 mg/dL (ref 8.9–10.3)
Chloride: 92 mmol/L — ABNORMAL LOW (ref 98–111)
Creatinine, Ser: 0.97 mg/dL (ref 0.44–1.00)
GFR, Estimated: 56 mL/min — ABNORMAL LOW (ref >60)
Glucose, Bld: 98 mg/dL (ref 70–99)
Potassium: 3.4 mmol/L — ABNORMAL LOW (ref 3.5–5.1)
Sodium: 138 mmol/L (ref 135–145)
Total Bilirubin: 0.7 mg/dL (ref 0.0–1.2)
Total Protein: 7.1 g/dL (ref 6.5–8.1)

## 2024-03-25 LAB — CBC WITH DIFFERENTIAL/PLATELET
Abs Immature Granulocytes: 0.05 K/uL (ref 0.00–0.07)
Basophils Absolute: 0 K/uL (ref 0.0–0.1)
Basophils Relative: 0 %
Eosinophils Absolute: 0.1 K/uL (ref 0.0–0.5)
Eosinophils Relative: 1 %
HCT: 36.4 % (ref 36.0–46.0)
Hemoglobin: 11.5 g/dL — ABNORMAL LOW (ref 12.0–15.0)
Immature Granulocytes: 1 %
Lymphocytes Relative: 11 %
Lymphs Abs: 0.8 K/uL (ref 0.7–4.0)
MCH: 30.4 pg (ref 26.0–34.0)
MCHC: 31.6 g/dL (ref 30.0–36.0)
MCV: 96.3 fL (ref 80.0–100.0)
Monocytes Absolute: 1 K/uL (ref 0.1–1.0)
Monocytes Relative: 14 %
Neutro Abs: 4.9 K/uL (ref 1.7–7.7)
Neutrophils Relative %: 73 %
Platelets: 236 K/uL (ref 150–400)
RBC: 3.78 MIL/uL — ABNORMAL LOW (ref 3.87–5.11)
RDW: 12.7 % (ref 11.5–15.5)
WBC: 6.7 K/uL (ref 4.0–10.5)
nRBC: 0 % (ref 0.0–0.2)

## 2024-03-25 LAB — URINALYSIS, ROUTINE W REFLEX MICROSCOPIC
Bilirubin Urine: NEGATIVE
Glucose, UA: NEGATIVE mg/dL
Hgb urine dipstick: NEGATIVE
Ketones, ur: 5 mg/dL — AB
Leukocytes,Ua: NEGATIVE
Nitrite: NEGATIVE
Protein, ur: NEGATIVE mg/dL
Specific Gravity, Urine: 1.018 (ref 1.005–1.030)
pH: 8 (ref 5.0–8.0)

## 2024-03-25 LAB — TROPONIN I (HIGH SENSITIVITY)
Troponin I (High Sensitivity): 23 ng/L — ABNORMAL HIGH (ref <18)
Troponin I (High Sensitivity): 24 ng/L — ABNORMAL HIGH (ref <18)

## 2024-03-25 MED ORDER — METOPROLOL TARTRATE 50 MG PO TABS: 50.0000 mg | ORAL_TABLET | Freq: Two times a day (BID) | ORAL | 0 refills | Status: DC

## 2024-03-25 NOTE — Discharge Instructions (Addendum)
 Please begin taking your metoprolol at 50 mg twice a day instead of 25

## 2024-03-25 NOTE — ED Triage Notes (Signed)
 Pt arrived via EMS from home. Pt complains of generalized weakness, and heart palpitations. Pt does have a hx of A-fib. Pt is A&Ox4.

## 2024-03-25 NOTE — ED Provider Notes (Signed)
 [facility] Provider Note   Event Date/Time   First MD Initiated Contact with Patient 03/25/24 0700     (approximate) History  No chief complaint on file.  HPI Norma Newton is a 89 y.o. female with a past medical history of hypertension and paroxysmal atrial fibrillation on Xarelto who presents complaining of generalized weakness in the setting of of atrial fibrillation.  Patient states that she was also recently treated for a sinus infection with little improvement in her symptoms despite antibiotic treatment.  Patient states she called EMS this morning when her generalized weakness resulted in her being unable to perform her activities of daily living. ROS: Patient currently denies any vision changes, tinnitus, difficulty speaking, facial droop, sore throat, chest pain, shortness of breath, abdominal pain, nausea/vomiting/diarrhea, dysuria, or numbness/paresthesias in any extremity   Physical Exam  Triage Vital Signs: ED Triage Vitals  Encounter Vitals Group     BP 03/25/24 0654 (!) 169/82     Systolic BP Percentile --      Diastolic BP Percentile --      Pulse Rate 03/25/24 0652 (!) 112     Resp --      Temp 03/25/24 0652 97.7 F (36.5 C)     Temp Source 03/25/24 0652 Oral     SpO2 03/25/24 0652 96 %     Weight --      Height --      Head Circumference --      Peak Flow --      Pain Score 03/25/24 0653 3     Pain Loc --      Pain Education --      Exclude from Growth Chart --    Most recent vital signs: Vitals:   03/25/24 0800 03/25/24 1026  BP:  (!) 143/70  Pulse: (!) 103 97  Resp: 19 (!) 23  Temp:    SpO2: 95% 96%   General: Awake, oriented x4. CV:  Good peripheral perfusion.  Resp:  Normal effort.  Abd:  No distention.  Other:  Elderly well-developed, well-nourished Caucasian female resting comfortably in no acute distress ED Results / Procedures / Treatments  Labs (all labs ordered are listed, but only abnormal results are  displayed) Labs Reviewed  COMPREHENSIVE METABOLIC PANEL WITH GFR - Abnormal; Notable for the following components:      Result Value   Potassium 3.4 (*)    Chloride 92 (*)    CO2 35 (*)    BUN 25 (*)    GFR, Estimated 56 (*)    All other components within normal limits  CBC WITH DIFFERENTIAL/PLATELET - Abnormal; Notable for the following components:   RBC 3.78 (*)    Hemoglobin 11.5 (*)    All other components within normal limits  URINALYSIS, ROUTINE W REFLEX MICROSCOPIC - Abnormal; Notable for the following components:   Color, Urine YELLOW (*)    APPearance HAZY (*)    Ketones, ur 5 (*)    All other components within normal limits  TROPONIN I (HIGH SENSITIVITY) - Abnormal; Notable for the following components:   Troponin I (High Sensitivity) 23 (*)    All other components within normal limits  TROPONIN I (HIGH SENSITIVITY) - Abnormal; Notable for the following components:   Troponin I (High Sensitivity) 24 (*)    All other components within normal limits   EKG ED ECG REPORT I, Charleen Conn, the attending physician, personally viewed and interpreted this ECG. Date: 03/25/2024 EKG  Time: 0659 Rate: 97 Rhythm: Atrial fibrillation QRS Axis: normal Intervals: normal ST/T Wave abnormalities: normal Narrative Interpretation: no evidence of acute ischemia RADIOLOGY ED MD interpretation: Single view portable chest x-ray independently interpreted and shows chronic cardiomegaly and small pleural effusions -Agree with radiology assessment Official radiology report(s): DG Chest Port 1 View Result Date: 03/25/2024 CLINICAL DATA:  89 year old female with weakness and palpitations. EXAM: PORTABLE CHEST 1 VIEW COMPARISON:  Portable chest 09/01/2019 and earlier. FINDINGS: Portable AP upright view at 0730 hours. Lower lung volumes. Stable cardiomegaly and mediastinal contours. Visualized tracheal air column is within normal limits. Small layering pleural effusions suspected, but not  significantly different from the 2020 exam. No superimposed pneumothorax, pulmonary edema, consolidation. No acute osseous abnormality identified. Paucity of visible bowel gas. IMPRESSION: Chronic cardiomegaly and small pleural effusions.  No overt edema. Electronically Signed   By: Marlise Simpers M.D.   On: 03/25/2024 07:47   PROCEDURES: Critical Care performed: No .1-3 Lead EKG Interpretation  Performed by: Charleen Conn, MD Authorized by: Charleen Conn, MD     Interpretation: abnormal     ECG rate:  91   ECG rate assessment: normal     Rhythm: atrial fibrillation     Ectopy: none     Conduction: normal    MEDICATIONS ORDERED IN ED: Medications - No data to display IMPRESSION / MDM / ASSESSMENT AND PLAN / ED COURSE  I reviewed the triage vital signs and the nursing notes.                             The patient is on the cardiac monitor to evaluate for evidence of arrhythmia and/or significant heart rate changes. Patient's presentation is most consistent with acute presentation with potential threat to life or bodily function. + atrial fibrillation w/ RVR DDx: Pneumothorax, Pneumonia, Pulmonary Embolus, Tamponade, ACS, Thyrotoxicosis.  No history or evidence decompensated heart failure. Given their history and exam it is likely this patient is unlikely to spontaneously revert to a rate controlled rhythm and necessitates a thorough workup for their arrhythmia. Workup: ECG, CXR, CBC, BMP, UA, Troponin, BNP, TSH, Ca-Mag-Phos Interventions: Defer cardioversion as patient is hemodynamically stable at this time.  Patient is mildly tachycardic from her atrial fibrillation and has improved during her hospitalization with heart rate at discharge in the 90s.    Patient was encouraged to follow-up with her cardiologist as soon as possible for further management of her rate controlling medications  Disposition: Admit   FINAL CLINICAL IMPRESSION(S) / ED DIAGNOSES   Final diagnoses:  Paroxysmal  atrial fibrillation (HCC)  Generalized weakness   Rx / DC Orders   ED Discharge Orders          Ordered    metoprolol tartrate (LOPRESSOR) 50 MG tablet  2 times daily        03/25/24 0946    Ambulatory referral to Cardiology       Comments: If you have not heard from the Cardiology office within the next 72 hours please call [phone].   03/25/24 0947           Note:  This document was prepared using Dragon voice recognition software and may include unintentional dictation errors.   Burk Hoctor K, MD 03/25/24 [phone]

## 2024-03-30 ENCOUNTER — Emergency Department

## 2024-03-30 ENCOUNTER — Emergency Department
Admission: EM | Admit: 2024-03-30 | Discharge: 2024-03-30 | Disposition: A | Discharge status: Home / Self Care | Attending: Emergency Medicine | Admitting: Emergency Medicine

## 2024-03-30 DIAGNOSIS — R600 Localized edema: Secondary | ICD-10-CM | POA: Diagnosis not present

## 2024-03-30 DIAGNOSIS — I10 Essential (primary) hypertension: Secondary | ICD-10-CM | POA: Diagnosis not present

## 2024-03-30 DIAGNOSIS — R0602 Shortness of breath: Secondary | ICD-10-CM | POA: Diagnosis not present

## 2024-03-30 DIAGNOSIS — J449 Chronic obstructive pulmonary disease, unspecified: Secondary | ICD-10-CM | POA: Diagnosis not present

## 2024-03-30 DIAGNOSIS — R531 Weakness: Secondary | ICD-10-CM

## 2024-03-30 DIAGNOSIS — F0150 Vascular dementia without behavioral disturbance: Secondary | ICD-10-CM | POA: Insufficient documentation

## 2024-03-30 DIAGNOSIS — Z7901 Long term (current) use of anticoagulants: Secondary | ICD-10-CM | POA: Diagnosis not present

## 2024-03-30 DIAGNOSIS — R0989 Other specified symptoms and signs involving the circulatory and respiratory systems: Secondary | ICD-10-CM | POA: Diagnosis not present

## 2024-03-30 LAB — CBC WITH DIFFERENTIAL/PLATELET
Abs Immature Granulocytes: 0.03 10*3/uL (ref 0.00–0.07)
Basophils Absolute: 0 10*3/uL (ref 0.0–0.1)
Basophils Relative: 0 %
Eosinophils Absolute: 0.1 10*3/uL (ref 0.0–0.5)
Eosinophils Relative: 1 %
HCT: 36.4 % (ref 36.0–46.0)
Hemoglobin: 11.8 g/dL — ABNORMAL LOW (ref 12.0–15.0)
Immature Granulocytes: 0 %
Lymphocytes Relative: 14 %
Lymphs Abs: 1 10*3/uL (ref 0.7–4.0)
MCH: 30.9 pg (ref 26.0–34.0)
MCHC: 32.4 g/dL (ref 30.0–36.0)
MCV: 95.3 fL (ref 80.0–100.0)
Monocytes Absolute: 1 10*3/uL (ref 0.1–1.0)
Monocytes Relative: 14 %
Neutro Abs: 4.7 10*3/uL (ref 1.7–7.7)
Neutrophils Relative %: 71 %
Platelets: 234 10*3/uL (ref 150–400)
RBC: 3.82 MIL/uL — ABNORMAL LOW (ref 3.87–5.11)
RDW: 13.1 % (ref 11.5–15.5)
WBC: 6.7 10*3/uL (ref 4.0–10.5)
nRBC: 0 % (ref 0.0–0.2)

## 2024-03-30 LAB — COMPREHENSIVE METABOLIC PANEL WITH GFR
ALT: 19 U/L (ref 0–44)
AST: 34 U/L (ref 15–41)
Albumin: 3.6 g/dL (ref 3.5–5.0)
Alkaline Phosphatase: 65 U/L (ref 38–126)
Anion gap: 13 (ref 5–15)
BUN: 23 mg/dL (ref 8–23)
CO2: 35 mmol/L — ABNORMAL HIGH (ref 22–32)
Calcium: 10.6 mg/dL — ABNORMAL HIGH (ref 8.9–10.3)
Chloride: 89 mmol/L — ABNORMAL LOW (ref 98–111)
Creatinine, Ser: 0.94 mg/dL (ref 0.44–1.00)
GFR, Estimated: 58 mL/min — ABNORMAL LOW (ref >60)
Glucose, Bld: 107 mg/dL — ABNORMAL HIGH (ref 70–99)
Potassium: 3.5 mmol/L (ref 3.5–5.1)
Sodium: 137 mmol/L (ref 135–145)
Total Bilirubin: 0.8 mg/dL (ref 0.0–1.2)
Total Protein: 7.3 g/dL (ref 6.5–8.1)

## 2024-03-30 LAB — BRAIN NATRIURETIC PEPTIDE: B Natriuretic Peptide: 174.5 pg/mL — ABNORMAL HIGH (ref 0.0–100.0)

## 2024-03-30 LAB — RESP PANEL BY RT-PCR (RSV, FLU A&B, COVID)  RVPGX2
Influenza A by PCR: NEGATIVE
Influenza B by PCR: NEGATIVE
Resp Syncytial Virus by PCR: NEGATIVE
SARS Coronavirus 2 by RT PCR: NEGATIVE

## 2024-03-30 LAB — URINALYSIS, ROUTINE W REFLEX MICROSCOPIC
Bilirubin Urine: NEGATIVE
Glucose, UA: NEGATIVE mg/dL
Hgb urine dipstick: NEGATIVE
Ketones, ur: 5 mg/dL — AB
Leukocytes,Ua: NEGATIVE
Nitrite: NEGATIVE
Protein, ur: NEGATIVE mg/dL
Specific Gravity, Urine: 1.014 (ref 1.005–1.030)
pH: 7 (ref 5.0–8.0)

## 2024-03-30 LAB — LIPASE, BLOOD: Lipase: 43 U/L (ref 11–51)

## 2024-03-30 MED ORDER — SODIUM CHLORIDE 0.9 % IV BOLUS
500.0000 mL | Freq: Once | INTRAVENOUS | Status: AC
  Administered 2024-03-30: 500 mL via INTRAVENOUS

## 2024-03-30 MED ORDER — FUROSEMIDE 20 MG PO TABS
20.0000 mg | ORAL_TABLET | Freq: Every day | ORAL | 0 refills | Status: DC
Start: 2024-03-30 — End: 2024-04-09

## 2024-03-30 MED ORDER — FUROSEMIDE 10 MG/ML IJ SOLN
40.0000 mg | Freq: Once | INTRAMUSCULAR | Status: DC
  Filled 2024-03-30: qty 4

## 2024-03-30 MED ORDER — FUROSEMIDE 40 MG PO TABS
40.0000 mg | ORAL_TABLET | Freq: Once | ORAL | Status: AC
  Administered 2024-03-30: 40 mg via ORAL
  Filled 2024-03-30: qty 1

## 2024-03-30 NOTE — Discharge Instructions (Addendum)
 I have sent a prescription for a couple extra days of fluid pills to hopefully decrease your weakness and shortness of breath.  Please follow-up with your primary care provider in the next couple of days.  I have also sent a follow-up in the heart failure clinic so they can evaluate you as well.  Please return immediately if you have any severe worsening symptoms.

## 2024-03-30 NOTE — ED Triage Notes (Signed)
 Pt arrives via [facility] from home c/o generalized weakness over the last week. Recently seen by PCP for same and diagnosed with a-fib and started on metoprolol . EMS reports that pt also has had bilateral leg swelling. Denies CP or SOB, but does endorse intermittent palpitations.

## 2024-03-30 NOTE — ED Provider Notes (Signed)
 [facility] Provider Note    Event Date/Time   First MD Initiated Contact with Patient 03/30/24 1739     (approximate)   History   Weakness   HPI Norma Newton is a 89 y.o. female with history of COPD, vascular dementia, A-fib on Xarelto presented today for weakness.  She states she has felt weak over the past couple of days but worsened today.  She feels intermittent pain when she urinates but otherwise denies fever, cough, congestion, shortness of breath, chest pain, nausea, vomiting, abdominal pain.  Reportedly had some increased leg swelling.  Chart review: Seen in the ED 5 days ago for similar symptoms with largely reassuring workup.     Physical Exam   Triage Vital Signs: ED Triage Vitals [03/30/24 1739]  Encounter Vitals Group     BP (!) 174/84     Systolic BP Percentile      Diastolic BP Percentile      Pulse Rate (!) 104     Resp 18     Temp 97.9 F (36.6 C)     Temp Source Oral     SpO2 97 %     Weight      Height      Head Circumference      Peak Flow      Pain Score      Pain Loc      Pain Education      Exclude from Growth Chart     Most recent vital signs: Vitals:   03/30/24 1739 03/30/24 1900  BP: (!) 174/84 (!) 158/66  Pulse: (!) 104 81  Resp: 18 20  Temp: 97.9 F (36.6 C)   SpO2: 97% 98%   I have reviewed the vital signs. General:  Awake, alert, no acute distress. Head:  Normocephalic, Atraumatic. EENT:  PERRL, EOMI, Oral mucosa pink and moist, Neck is supple.  Dilated right pupil with recent cataract surgery and this is her baseline. Cardiovascular: Regular rate, 2+ distal pulses. Respiratory:  Normal respiratory effort, symmetrical expansion, no distress.   Extremities:  Moving all four extremities through full ROM without pain.   Neuro:  Alert and oriented.  Interacting appropriately.   Skin:  Warm, dry, no rash.   Psych: Appropriate affect.    ED Results / Procedures / Treatments   Labs (all labs  ordered are listed, but only abnormal results are displayed) Labs Reviewed  CBC WITH DIFFERENTIAL/PLATELET - Abnormal; Notable for the following components:      Result Value   RBC 3.82 (*)    Hemoglobin 11.8 (*)    All other components within normal limits  COMPREHENSIVE METABOLIC PANEL WITH GFR - Abnormal; Notable for the following components:   Chloride 89 (*)    CO2 35 (*)    Glucose, Bld 107 (*)    Calcium 10.6 (*)    GFR, Estimated 58 (*)    All other components within normal limits  URINALYSIS, ROUTINE W REFLEX MICROSCOPIC - Abnormal; Notable for the following components:   Color, Urine YELLOW (*)    APPearance HAZY (*)    Ketones, ur 5 (*)    All other components within normal limits  BRAIN NATRIURETIC PEPTIDE - Abnormal; Notable for the following components:   B Natriuretic Peptide 174.5 (*)    All other components within normal limits  RESP PANEL BY RT-PCR (RSV, FLU A&B, COVID)  RVPGX2  LIPASE, BLOOD     EKG My EKG interpretation: Rate of  102, atrial fibrillation, right axis deviation.  No acute ST elevations or depressions   RADIOLOGY Independently interpreted chest x-ray with new right-sided pleural effusion   PROCEDURES:  Critical Care performed: No  Procedures   MEDICATIONS ORDERED IN ED: Medications  furosemide (LASIX) tablet 40 mg (has no administration in time range)  sodium chloride  0.9 % bolus 500 mL (0 mLs Intravenous Stopped 03/30/24 2028)     IMPRESSION / MDM / ASSESSMENT AND PLAN / ED COURSE  I reviewed the triage vital signs and the nursing notes.                              Differential diagnosis includes, but is not limited to, respiratory infection, UTI, dehydration, failure to thrive, poor p.o. intake, electrolyte abnormality  Patient's presentation is most consistent with acute complicated illness / injury requiring diagnostic workup.  Patient is a 89 year old female presenting today for generalized weakness.  Neurological exam on  arrival was nonfocal.  She is in A-fib tensive RVR.  Will give 500 cc NS bolus.  Otherwise has no acute symptoms outside of her weakness and intermittent dysuria.  Chest x-ray shows new right-sided pleural effusion.  BNP was very mildly elevated today.  Otherwise CMP and CBC reassuring.  UA negative.  Negative for COVID, flu, RSV.  Patient was ambulated and did have some shortness of breath and weakness.  Given the slight new pleural effusion and slight elevation in her BNP, I recommended admission for ongoing evaluation and help with pulling the fluid off her lungs.  Patient and family stated they prefer to go home at this time with her otherwise feeling well.  Her vital signs are stable here with no hypoxia or tachypnea at rest.  Will send him home with a couple additional doses of Lasix to see if that helps remove the fluid and have her follow-up with her PCP as well as the heart failure clinic.  They were given strict return precautions.  The patient is on the cardiac monitor to evaluate for evidence of arrhythmia and/or significant heart rate changes. Clinical Course as of 03/30/24 2118  Tue Mar 30, 2024  1828 DG Chest 2 View Progressive worsening of fluid on the chest concerning for heart failure exacerbation [DW]    Clinical Course User Index [DW] Kandee Orion, MD     FINAL CLINICAL IMPRESSION(S) / ED DIAGNOSES   Final diagnoses:  Weakness  Shortness of breath     Rx / DC Orders   ED Discharge Orders          Ordered    furosemide (LASIX) 20 MG tablet  Daily        03/30/24 2118    AMB referral to CHF clinic        03/30/24 2118             Note:  This document was prepared using Dragon voice recognition software and may include unintentional dictation errors.   Kandee Orion, MD 03/30/24 2119

## 2024-03-31 ENCOUNTER — Telehealth: Payer: Self-pay | Admitting: Cardiology

## 2024-03-31 NOTE — Telephone Encounter (Signed)
 Called to confirm/remind patient of their appointment at [facility] on 04/01/24.   Appointment:   [x] Confirmed  [] Left mess   [] No answer/No voice mail  [] VM Full/unable to leave message  [] Phone not in service  Patient reminded to bring all medications and/or complete list.  Confirmed patient has transportation. Gave directions, instructed to utilize valet parking.

## 2024-04-01 ENCOUNTER — Encounter: Admitting: Cardiology

## 2024-04-06 DIAGNOSIS — I509 Heart failure, unspecified: Secondary | ICD-10-CM | POA: Diagnosis not present

## 2024-04-06 DIAGNOSIS — D510 Vitamin B12 deficiency anemia due to intrinsic factor deficiency: Secondary | ICD-10-CM | POA: Diagnosis not present

## 2024-04-06 DIAGNOSIS — R531 Weakness: Secondary | ICD-10-CM | POA: Diagnosis not present

## 2024-04-06 DIAGNOSIS — F419 Anxiety disorder, unspecified: Secondary | ICD-10-CM | POA: Diagnosis not present

## 2024-04-06 DIAGNOSIS — R634 Abnormal weight loss: Secondary | ICD-10-CM | POA: Diagnosis not present

## 2024-04-06 DIAGNOSIS — I482 Chronic atrial fibrillation, unspecified: Secondary | ICD-10-CM | POA: Diagnosis not present

## 2024-04-06 DIAGNOSIS — K5900 Constipation, unspecified: Secondary | ICD-10-CM | POA: Diagnosis not present

## 2024-04-06 DIAGNOSIS — I110 Hypertensive heart disease with heart failure: Secondary | ICD-10-CM | POA: Diagnosis not present

## 2024-04-06 DIAGNOSIS — Z09 Encounter for follow-up examination after completed treatment for conditions other than malignant neoplasm: Secondary | ICD-10-CM | POA: Diagnosis not present

## 2024-04-08 ENCOUNTER — Telehealth: Payer: Self-pay | Admitting: Family

## 2024-04-08 NOTE — Progress Notes (Unsigned)
 [facility] Note   Referring Physician: EDP PCP: Efraim Grange, NP (last seen 05/25) Cardiologist: Burney Carter, MD (last seen 08/24)  Chief Complaint:    HPI:  Norma Newton is a 89 y/o female with a history of HTN, PAF, COPD, vascular dementia, hyperlipidemia, seizure, B12 anemia, anxiety, breast cancer and chronic heart failure.   Was in the ED 03/25/24 with generalized weakness due to atrial fibrillation.   Was in the ED 03/30/24 with continued weakness. Slight pain with urination. Neuro exam nonfocal. Found to be in AF RVR. IVF given. Chest x-ray shows new right-sided pleural effusion. BNP was very mildly elevated today. Patient and family declined admission.   She presents today for her initial HF visit with a chief complaint of   Review of Systems: [y] = yes, [ ]  = no   General: Weight gain [ ] ; Weight loss [ ] ; Anorexia [ ] ; Fatigue [ ] ; Fever [ ] ; Chills [ ] ; Weakness [ ]   Cardiac: Chest pain/pressure [ ] ; Resting SOB [ ] ; Exertional SOB [ ] ; Orthopnea [ ] ; Pedal Edema [ ] ; Palpitations [ ] ; Syncope [ ] ; Presyncope [ ] ; Paroxysmal nocturnal dyspnea[ ]   Pulmonary: Cough [ ] ; Wheezing[ ] ; Hemoptysis[ ] ; Sputum [ ] ; Snoring [ ]   GI: Vomiting[ ] ; Dysphagia[ ] ; Melena[ ] ; Hematochezia [ ] ; Heartburn[ ] ; Abdominal pain [ ] ; Constipation [ ] ; Diarrhea [ ] ; BRBPR [ ]   GU: Hematuria[ ] ; Dysuria [ ] ; Nocturia[ ]   Vascular: Pain in legs with walking [ ] ; Pain in feet with lying flat [ ] ; Non-healing sores [ ] ; Stroke [ ] ; TIA [ ] ; Slurred speech [ ] ;  Neuro: Headaches[ ] ; Vertigo[ ] ; Seizures[ ] ; Paresthesias[ ] ;Blurred vision [ ] ; Diplopia [ ] ; Vision changes [ ]   Ortho/Skin: Arthritis [ ] ; Joint pain [ ] ; Muscle pain [ ] ; Joint swelling [ ] ; Back Pain [ ] ; Rash [ ]   Psych: Depression[ ] ; Anxiety[ ]   Heme: Bleeding problems [ ] ; Clotting disorders [ ] ; Anemia [ ]   Endocrine: Diabetes [ ] ; Thyroid  dysfunction[ ]    Past Medical History:  Diagnosis Date    Anemia    vitamin b12 deficiency   Anxiety    Breast cancer (HCC) 1975   right breast with mastectomy   COPD (chronic obstructive pulmonary disease) (HCC)    PATIENT AND DAUGHTER DENY THIS DIAGNOSIS   Dysrhythmia    atrial fib...xarelto   Falls frequently 07/23/2019   has had several falls in last 2 months.  unknown etiology   GERD (gastroesophageal reflux disease)    PATIENT DENIES   Hypertension    Seizure (HCC)    last seizure (focal) was years ago   Vascular dementia (HCC)    recently diagnosed. confused at preadmit testing appt.    Current Outpatient Medications  Medication Sig Dispense Refill   acetaminophen  (TYLENOL ) 650 MG CR tablet Take 1,300 mg by mouth every 8 (eight) hours as needed for pain.     ASPERCREME LIDOCAINE  EX Apply 1 application topically daily as needed (pain).     divalproex (DEPAKOTE) 250 MG DR tablet Take 250 mg by mouth 3 (three) times daily.     fluticasone (VERAMYST) 27.5 MCG/SPRAY nasal spray Place 2 sprays into the nose daily.     furosemide (LASIX) 20 MG tablet Take 1 tablet (20 mg total) by mouth daily for 5 days. 5 tablet 0   loratadine (CLARITIN) 10 MG tablet Take 10 mg by mouth daily.  Melatonin 3 MG CAPS Take 3 mg by mouth at bedtime.     meloxicam (MOBIC) 7.5 MG tablet Take 7.5 mg by mouth daily.     memantine (NAMENDA) 5 MG tablet Take 5 mg by mouth 2 (two) times daily.     metoprolol  tartrate (LOPRESSOR ) 50 MG tablet Take 1 tablet (50 mg total) by mouth 2 (two) times daily. 60 tablet 0   Multiple Vitamin (MULTIVITAMIN WITH MINERALS) TABS tablet Take 1 tablet by mouth daily.     rivaroxaban (XARELTO) 10 MG TABS tablet Take 10 mg by mouth daily with supper.     traMADol (ULTRAM) 50 MG tablet Take 25-50 mg by mouth every 6 (six) hours as needed for pain.     No current facility-administered medications for this visit.    Allergies  Allergen Reactions   Doxycycline Calcium Rash    Reaction happened a long time ago.      Social  History   Socioeconomic History   Marital status: Married    Spouse name: Royston Cornea   Number of children: 3   Years of education: Not on file   Highest education level: Not on file  Occupational History   Occupation: was working at [employer]  Tobacco Use   Smoking status: Never   Smokeless tobacco: Never  Vaping Use   Vaping status: Never Used  Substance and Sexual Activity   Alcohol  use: Not Currently   Drug use: Never   Sexual activity: Not Currently  Other Topics Concern   Not on file  Social History Narrative   Recent falls for patient.  Lives with 98 year old husband.  She is having difficulty with falls.   Social Drivers of Corporate investment banker Strain: Low Risk  (12/04/2023)   Received from [facility]   Overall Financial Resource Strain (CARDIA)    Difficulty of Paying Living Expenses: Not hard at all  Food Insecurity: No Food Insecurity (12/04/2023)   Received from [facility]   Hunger Vital Sign    Ran Out of Food in the Last Year: Never true    Worried About Running Out of Food in the Last Year: Never true  Transportation Needs: No Transportation Needs (12/04/2023)   Received from [facility]   PRAPARE - Transportation    Lack of Transportation (Non-Medical): No    In the past 12 months, has lack of transportation kept you from medical appointments or from getting medications?: No  Physical Activity: Not on file  Stress: Not on file  Social Connections: Not on file  Intimate Partner Violence: Not on file      Family History  Problem Relation Age of Onset   Breast cancer Sister    Breast cancer Sister        PHYSICAL EXAM: General:  Well appearing. No respiratory difficulty HEENT: normal Neck: supple. no JVD. Carotids 2+ bilat; no bruits. No lymphadenopathy or thyromegaly appreciated. Cor: PMI nondisplaced. Regular rate & rhythm. No rubs, gallops or murmurs. Lungs: clear Abdomen: soft,  nontender, nondistended. No hepatosplenomegaly. No bruits or masses. Good bowel sounds. Extremities: no cyanosis, clubbing, rash, edema Neuro: alert & oriented x 3, cranial nerves grossly intact. moves all 4 extremities w/o difficulty. Affect pleasant.  ECG:   ASSESSMENT & PLAN:     Charlette Console, FNP 04/08/24

## 2024-04-08 NOTE — Telephone Encounter (Signed)
 Called to confirm/remind patient of their appointment at the [facility] on 04/09/24.   Appointment:   [x] Confirmed  [] Left mess   [] No answer/No voice mail  [] VM Full/unable to leave message  [] Phone not in service  Patient reminded to bring all medications and/or complete list.  Confirmed patient has transportation. Gave directions, instructed to utilize valet parking.

## 2024-04-09 ENCOUNTER — Inpatient Hospital Stay (HOSPITAL_COMMUNITY)
Admission: RE | Admit: 2024-04-09 | Discharge: 2024-04-09 | Disposition: A | Discharge status: Home / Self Care | Source: Ambulatory Visit | Attending: Cardiology | Admitting: Cardiology

## 2024-04-09 ENCOUNTER — Encounter: Payer: Self-pay | Admitting: Family

## 2024-04-09 ENCOUNTER — Ambulatory Visit: Attending: Family | Admitting: Family

## 2024-04-09 ENCOUNTER — Other Ambulatory Visit (HOSPITAL_COMMUNITY): Payer: Self-pay | Admitting: Cardiology

## 2024-04-09 VITALS — BP 125/62 | HR 90

## 2024-04-09 DIAGNOSIS — F0150 Vascular dementia without behavioral disturbance: Secondary | ICD-10-CM | POA: Insufficient documentation

## 2024-04-09 DIAGNOSIS — J449 Chronic obstructive pulmonary disease, unspecified: Secondary | ICD-10-CM | POA: Insufficient documentation

## 2024-04-09 DIAGNOSIS — D649 Anemia, unspecified: Secondary | ICD-10-CM | POA: Diagnosis not present

## 2024-04-09 DIAGNOSIS — D519 Vitamin B12 deficiency anemia, unspecified: Secondary | ICD-10-CM | POA: Insufficient documentation

## 2024-04-09 DIAGNOSIS — I10 Essential (primary) hypertension: Secondary | ICD-10-CM | POA: Diagnosis not present

## 2024-04-09 DIAGNOSIS — Z853 Personal history of malignant neoplasm of breast: Secondary | ICD-10-CM | POA: Insufficient documentation

## 2024-04-09 DIAGNOSIS — I4891 Unspecified atrial fibrillation: Secondary | ICD-10-CM

## 2024-04-09 DIAGNOSIS — I480 Paroxysmal atrial fibrillation: Secondary | ICD-10-CM | POA: Diagnosis not present

## 2024-04-09 DIAGNOSIS — R5383 Other fatigue: Secondary | ICD-10-CM | POA: Diagnosis not present

## 2024-04-09 DIAGNOSIS — R600 Localized edema: Secondary | ICD-10-CM | POA: Insufficient documentation

## 2024-04-09 DIAGNOSIS — E785 Hyperlipidemia, unspecified: Secondary | ICD-10-CM | POA: Insufficient documentation

## 2024-04-09 DIAGNOSIS — I482 Chronic atrial fibrillation, unspecified: Secondary | ICD-10-CM | POA: Diagnosis not present

## 2024-04-09 DIAGNOSIS — R0602 Shortness of breath: Secondary | ICD-10-CM | POA: Diagnosis not present

## 2024-04-09 DIAGNOSIS — E7800 Pure hypercholesterolemia, unspecified: Secondary | ICD-10-CM | POA: Diagnosis not present

## 2024-04-09 DIAGNOSIS — Z9011 Acquired absence of right breast and nipple: Secondary | ICD-10-CM | POA: Diagnosis not present

## 2024-04-09 DIAGNOSIS — E782 Mixed hyperlipidemia: Secondary | ICD-10-CM

## 2024-04-09 DIAGNOSIS — F419 Anxiety disorder, unspecified: Secondary | ICD-10-CM | POA: Diagnosis not present

## 2024-04-09 DIAGNOSIS — Z7901 Long term (current) use of anticoagulants: Secondary | ICD-10-CM | POA: Insufficient documentation

## 2024-04-09 DIAGNOSIS — Z79899 Other long term (current) drug therapy: Secondary | ICD-10-CM | POA: Diagnosis not present

## 2024-04-09 DIAGNOSIS — G8929 Other chronic pain: Secondary | ICD-10-CM | POA: Diagnosis not present

## 2024-04-09 NOTE — Patient Instructions (Addendum)
 Medication Changes:  No medication changes.   Lab Work:  Go over to the [facility]ICAL [facility]ALL. Go pass the gift shop and have your blood work completed in 2 weeks.  We will only call you if the results are abnormal or if the provider would like to make medication changes.   Testing/Procedures:  Please have your echo completed Thursday, July 17th at 9:00 AM. Elene Griffes will check in for this at the [facility]. You have to arrive 15 MINS EARLY for preparation, otherwise you will have to reschedule.   Your provider has recommended that  you wear a Zio Patch for 7 days. This monitor will record your heart rhythm for our review.  IF you have any symptoms while wearing the monitor please press the button. If you have any issues with the patch or you notice a red or orange light on it please call the company at [phone]. Once you remove the patch please mail it back to the company as soon as possible so we can get the results.    Follow-Up with Shawnee Dellen, FNP after your echo has been completed.  At the [facility], you and your health needs are our priority. We have a designated team specialized in the treatment of Heart Failure. This Care Team includes your primary Heart Failure Specialized Cardiologist (physician), Advanced Practice Providers (APPs- Physician Assistants and Nurse Practitioners), and Pharmacist who all work together to provide you with the care you need, when you need it.   You may see any of the following providers on your designated Care Team at your next follow up:  Dr. Jules Oar Dr. Peder Bourdon Dr. Alwin Baars Dr. Judyth Nunnery Shawnee Dellen, FNP Bevely Brush, RPH-CPP  Please be sure to bring in all your medications bottles to every appointment.   Need to Contact Us :  If you have any questions or concerns before your next appointment please send us  a message through [place] or call our office at [phone].    TO LEAVE A MESSAGE FOR THE  NURSE SELECT OPTION 2, PLEASE LEAVE A MESSAGE INCLUDING: YOUR NAME DATE OF BIRTH CALL BACK NUMBER REASON FOR CALL**this is important as we prioritize the call backs  YOU WILL RECEIVE A CALL BACK THE SAME DAY AS LONG AS YOU CALL BEFORE 4:00 PM

## 2024-04-12 ENCOUNTER — Other Ambulatory Visit: Payer: Self-pay | Admitting: Gerontology

## 2024-04-12 DIAGNOSIS — R9389 Abnormal findings on diagnostic imaging of other specified body structures: Secondary | ICD-10-CM

## 2024-04-16 ENCOUNTER — Ambulatory Visit
Admission: RE | Admit: 2024-04-16 | Discharge: 2024-04-16 | Disposition: A | Discharge status: Home / Self Care | Source: Ambulatory Visit | Attending: Gerontology | Admitting: Gerontology

## 2024-04-16 DIAGNOSIS — I700 Atherosclerosis of aorta: Secondary | ICD-10-CM | POA: Diagnosis not present

## 2024-04-16 DIAGNOSIS — I3139 Other pericardial effusion (noninflammatory): Secondary | ICD-10-CM | POA: Diagnosis not present

## 2024-04-16 DIAGNOSIS — R9389 Abnormal findings on diagnostic imaging of other specified body structures: Secondary | ICD-10-CM | POA: Insufficient documentation

## 2024-04-16 DIAGNOSIS — J9859 Other diseases of mediastinum, not elsewhere classified: Secondary | ICD-10-CM | POA: Diagnosis not present

## 2024-04-16 MED ORDER — IOHEXOL 300 MG/ML  SOLN
75.0000 mL | Freq: Once | INTRAMUSCULAR | Status: AC | PRN
  Administered 2024-04-16: 75 mL via INTRAVENOUS

## 2024-04-19 ENCOUNTER — Telehealth: Payer: Self-pay | Admitting: Family

## 2024-04-19 DIAGNOSIS — R0602 Shortness of breath: Secondary | ICD-10-CM

## 2024-04-19 DIAGNOSIS — I10 Essential (primary) hypertension: Secondary | ICD-10-CM

## 2024-04-19 DIAGNOSIS — I480 Paroxysmal atrial fibrillation: Secondary | ICD-10-CM

## 2024-04-19 NOTE — Progress Notes (Signed)
Labs placed for [facility]

## 2024-04-20 DIAGNOSIS — I4891 Unspecified atrial fibrillation: Secondary | ICD-10-CM | POA: Diagnosis not present

## 2024-04-30 DIAGNOSIS — G8929 Other chronic pain: Secondary | ICD-10-CM | POA: Diagnosis not present

## 2024-04-30 DIAGNOSIS — I10 Essential (primary) hypertension: Secondary | ICD-10-CM | POA: Diagnosis not present

## 2024-04-30 DIAGNOSIS — R7303 Prediabetes: Secondary | ICD-10-CM | POA: Diagnosis not present

## 2024-04-30 DIAGNOSIS — I482 Chronic atrial fibrillation, unspecified: Secondary | ICD-10-CM | POA: Diagnosis not present

## 2024-04-30 DIAGNOSIS — I509 Heart failure, unspecified: Secondary | ICD-10-CM | POA: Diagnosis not present

## 2024-04-30 DIAGNOSIS — F419 Anxiety disorder, unspecified: Secondary | ICD-10-CM | POA: Diagnosis not present

## 2024-05-04 NOTE — Addendum Note (Signed)
 Encounter addended by: Debarah Garrison MATSU, RN on: 05/04/2024 12:14 PM  Actions taken: Imaging Exam ended

## 2024-05-09 ENCOUNTER — Ambulatory Visit (HOSPITAL_COMMUNITY): Payer: Self-pay | Admitting: Cardiology

## 2024-05-10 NOTE — Telephone Encounter (Signed)
 Called patient's daughter per Dr. Rolan with cardiac monitor results. Daughter reports patient passed away last Thursday 05-13-24 in he sleep.

## 2024-05-11 DEATH — deceased

## 2024-05-27 ENCOUNTER — Ambulatory Visit: Admission: RE | Admit: 2024-05-27 | Source: Ambulatory Visit
